# Patient Record
Sex: Female | Born: 1955 | Race: Black or African American | Hispanic: No | Marital: Married | Smoking: Never smoker
Health system: Southern US, Community
[De-identification: ages and names within clinical notes are randomized; demographics above are authoritative.]

## PROBLEM LIST (undated history)

## (undated) DIAGNOSIS — K219 Gastro-esophageal reflux disease without esophagitis: Secondary | ICD-10-CM

## (undated) DIAGNOSIS — I10 Essential (primary) hypertension: Secondary | ICD-10-CM

## (undated) DIAGNOSIS — F32A Depression, unspecified: Secondary | ICD-10-CM

## (undated) DIAGNOSIS — F329 Major depressive disorder, single episode, unspecified: Secondary | ICD-10-CM

## (undated) DIAGNOSIS — F419 Anxiety disorder, unspecified: Secondary | ICD-10-CM

## (undated) DIAGNOSIS — D649 Anemia, unspecified: Secondary | ICD-10-CM

## (undated) HISTORY — PX: ABDOMINAL HYSTERECTOMY: SHX81

---

## 1998-06-19 ENCOUNTER — Encounter: Payer: Self-pay | Admitting: Obstetrics

## 1998-06-19 ENCOUNTER — Ambulatory Visit (HOSPITAL_COMMUNITY): Admission: RE | Admit: 1998-06-19 | Discharge: 1998-06-19 | Payer: Self-pay | Admitting: Obstetrics

## 1999-08-06 ENCOUNTER — Encounter: Payer: Self-pay | Admitting: Obstetrics

## 1999-08-06 ENCOUNTER — Ambulatory Visit (HOSPITAL_COMMUNITY): Admission: RE | Admit: 1999-08-06 | Discharge: 1999-08-06 | Payer: Self-pay | Admitting: Obstetrics

## 2000-12-04 ENCOUNTER — Encounter: Payer: Self-pay | Admitting: Obstetrics

## 2000-12-04 ENCOUNTER — Ambulatory Visit (HOSPITAL_COMMUNITY): Admission: RE | Admit: 2000-12-04 | Discharge: 2000-12-04 | Payer: Self-pay | Admitting: Obstetrics

## 2001-09-07 ENCOUNTER — Encounter (HOSPITAL_BASED_OUTPATIENT_CLINIC_OR_DEPARTMENT_OTHER): Payer: Self-pay | Admitting: General Surgery

## 2001-09-07 ENCOUNTER — Ambulatory Visit (HOSPITAL_COMMUNITY): Admission: RE | Admit: 2001-09-07 | Discharge: 2001-09-07 | Payer: Self-pay | Admitting: General Surgery

## 2001-09-24 ENCOUNTER — Ambulatory Visit: Admission: RE | Admit: 2001-09-24 | Discharge: 2001-09-24 | Payer: Self-pay | Admitting: Gastroenterology

## 2001-09-24 ENCOUNTER — Encounter (INDEPENDENT_AMBULATORY_CARE_PROVIDER_SITE_OTHER): Payer: Self-pay | Admitting: Specialist

## 2001-11-24 ENCOUNTER — Encounter: Payer: Self-pay | Admitting: Obstetrics

## 2001-11-24 ENCOUNTER — Ambulatory Visit (HOSPITAL_COMMUNITY): Admission: RE | Admit: 2001-11-24 | Discharge: 2001-11-24 | Payer: Self-pay | Admitting: Obstetrics

## 2002-12-03 ENCOUNTER — Emergency Department (HOSPITAL_COMMUNITY): Admission: EM | Admit: 2002-12-03 | Discharge: 2002-12-03 | Payer: Self-pay | Admitting: Emergency Medicine

## 2004-01-24 ENCOUNTER — Encounter: Admission: RE | Admit: 2004-01-24 | Discharge: 2004-01-24 | Payer: Self-pay | Admitting: Obstetrics

## 2004-08-27 ENCOUNTER — Ambulatory Visit (HOSPITAL_COMMUNITY): Admission: RE | Admit: 2004-08-27 | Discharge: 2004-08-27 | Payer: Self-pay | Admitting: Family Medicine

## 2004-08-27 ENCOUNTER — Emergency Department (HOSPITAL_COMMUNITY): Admission: EM | Admit: 2004-08-27 | Discharge: 2004-08-27 | Payer: Self-pay | Admitting: Family Medicine

## 2005-07-21 ENCOUNTER — Emergency Department (HOSPITAL_COMMUNITY): Admission: EM | Admit: 2005-07-21 | Discharge: 2005-07-21 | Payer: Self-pay | Admitting: Emergency Medicine

## 2005-07-31 ENCOUNTER — Encounter: Admission: RE | Admit: 2005-07-31 | Discharge: 2005-07-31 | Payer: Self-pay | Admitting: Obstetrics

## 2006-01-12 ENCOUNTER — Emergency Department (HOSPITAL_COMMUNITY): Admission: EM | Admit: 2006-01-12 | Discharge: 2006-01-12 | Payer: Self-pay | Admitting: Emergency Medicine

## 2007-01-19 ENCOUNTER — Emergency Department (HOSPITAL_COMMUNITY): Admission: EM | Admit: 2007-01-19 | Discharge: 2007-01-19 | Payer: Self-pay | Admitting: Emergency Medicine

## 2007-02-03 ENCOUNTER — Ambulatory Visit (HOSPITAL_COMMUNITY): Admission: RE | Admit: 2007-02-03 | Discharge: 2007-02-03 | Payer: Self-pay | Admitting: Emergency Medicine

## 2007-04-01 ENCOUNTER — Encounter: Admission: RE | Admit: 2007-04-01 | Discharge: 2007-04-01 | Payer: Self-pay | Admitting: Internal Medicine

## 2008-04-19 ENCOUNTER — Encounter: Admission: RE | Admit: 2008-04-19 | Discharge: 2008-04-19 | Payer: Self-pay | Admitting: Internal Medicine

## 2008-04-21 ENCOUNTER — Encounter: Admission: RE | Admit: 2008-04-21 | Discharge: 2008-04-21 | Payer: Self-pay | Admitting: Internal Medicine

## 2009-04-19 ENCOUNTER — Encounter: Admission: RE | Admit: 2009-04-19 | Discharge: 2009-04-19 | Payer: Self-pay | Admitting: Internal Medicine

## 2010-04-23 ENCOUNTER — Encounter: Admission: RE | Admit: 2010-04-23 | Discharge: 2010-04-23 | Payer: Self-pay | Admitting: Internal Medicine

## 2010-09-15 ENCOUNTER — Encounter: Payer: Self-pay | Admitting: Emergency Medicine

## 2010-09-15 ENCOUNTER — Encounter: Payer: Self-pay | Admitting: Obstetrics

## 2010-09-16 ENCOUNTER — Encounter: Payer: Self-pay | Admitting: Internal Medicine

## 2011-01-10 NOTE — Procedures (Signed)
[facility]  Patient:    Michele Conrad Visit Number: [id number] MRN: [id number]          Service Type: Attending:  Petra Kuba, M.D. Dictated by:   Petra Kuba, M.D. Proc. Date: 09/24/01   CC:         Luisa Hart L. Lurene Shadow, M.D.  Kathreen Cosier, M.D.   Procedure Report  PROCEDURE:  Colonoscopy with polypectomy.  Consent was signed after risks, benefits, methods and options were thoroughly discussed in the office.  MEDICINES USED:  Demerol 100 mg, Versed 10 mg.  INDICATION:  Family history of colon cancer; some mild changes in bowel habits; right lower quadrant pain, questionable adhesional versus hernia.  DESCRIPTION OF PROCEDURE:  Rectal inspection was pertinent for external hemorrhoids, small. Digital exam was negative. Pediatric video colonoscope was inserted and easily advanced around the colon to the cecum. This did not require any abdominal pressure or any position changes. No obvious abnormality was seen on insertion. The cecum was identified by the appendiceal orifice and the ileocecal valve. _______ scope was inserted shortways in the terminal ileum which was normal. Photo documentation was obtained. The scope was slowly withdrawn. In the ascending colon, one diverticulum was seen. The scope was slowly withdrawn. No other diverticula were seen as we slowly withdrew back to the rectum. In the proximal level of the splenic level, a 3-mm polyp was seen and hot biopsied x 2. On slow withdrawal, three other tiny questionable polyps were seen in the sigmoid and hot biopsied, put in the same container. No other abnormalities were seen as we slowly withdrew back the rectum. Once back in the rectum, the scope was retroflexed which revealed some internal hemorrhoids. The scope was straightened, readvanced a short ways up the left side of the colon. Air was suctioned, scope removed. The patient tolerated the procedure well. There was no  obvious immediate complication.  ENDOSCOPIC DIAGNOSES: 1. Internal/external hemorrhoids. 2. One right-sided diverticulum is seen. 3. Four left-sided tiny to small polyps seen, all hot biopsied. 4. Otherwise within normal limits to the terminal ileum.  PLAN:  Await pathology to determine future colonic screening. Happy to see back p.r.n., otherwise return care to Dr. Lurene Shadow and Dr. Gaynell Face for the customary healthcare maintenance to include yearly rectals and guaiacs. Put her on the customary one week postpolypectomy diet. Dictated by:   Petra Kuba, M.D. Attending:  Petra Kuba, M.D. DD:  09/24/01 TD:  09/25/01 Job: [id number]

## 2011-05-30 ENCOUNTER — Other Ambulatory Visit: Payer: Self-pay | Admitting: Internal Medicine

## 2011-05-30 DIAGNOSIS — Z1231 Encounter for screening mammogram for malignant neoplasm of breast: Secondary | ICD-10-CM

## 2011-06-12 ENCOUNTER — Ambulatory Visit
Admission: RE | Admit: 2011-06-12 | Discharge: 2011-06-12 | Disposition: A | Discharge status: Home / Self Care | Payer: PRIVATE HEALTH INSURANCE | Source: Ambulatory Visit | Attending: Internal Medicine | Admitting: Internal Medicine

## 2011-06-12 DIAGNOSIS — Z1231 Encounter for screening mammogram for malignant neoplasm of breast: Secondary | ICD-10-CM

## 2011-09-26 ENCOUNTER — Other Ambulatory Visit (HOSPITAL_COMMUNITY)
Admission: RE | Admit: 2011-09-26 | Discharge: 2011-09-26 | Disposition: A | Discharge status: Home / Self Care | Payer: 59 | Source: Ambulatory Visit | Attending: Internal Medicine | Admitting: Internal Medicine

## 2011-09-26 DIAGNOSIS — Z01419 Encounter for gynecological examination (general) (routine) without abnormal findings: Secondary | ICD-10-CM | POA: Insufficient documentation

## 2011-09-26 DIAGNOSIS — Z1159 Encounter for screening for other viral diseases: Secondary | ICD-10-CM | POA: Insufficient documentation

## 2012-06-15 ENCOUNTER — Other Ambulatory Visit: Payer: Self-pay | Admitting: Internal Medicine

## 2012-06-15 DIAGNOSIS — Z1231 Encounter for screening mammogram for malignant neoplasm of breast: Secondary | ICD-10-CM

## 2012-07-02 ENCOUNTER — Ambulatory Visit (HOSPITAL_COMMUNITY)
Admission: RE | Admit: 2012-07-02 | Discharge: 2012-07-02 | Disposition: A | Discharge status: Home / Self Care | Payer: 59 | Source: Ambulatory Visit | Attending: Internal Medicine | Admitting: Internal Medicine

## 2012-07-02 DIAGNOSIS — Z1231 Encounter for screening mammogram for malignant neoplasm of breast: Secondary | ICD-10-CM | POA: Insufficient documentation

## 2012-07-19 ENCOUNTER — Ambulatory Visit: Payer: 59

## 2012-07-21 ENCOUNTER — Encounter (HOSPITAL_COMMUNITY): Payer: Self-pay | Admitting: Pharmacy Technician

## 2012-07-28 ENCOUNTER — Encounter (HOSPITAL_COMMUNITY)
Admission: RE | Disposition: A | Discharge status: Home / Self Care | Payer: Self-pay | Source: Ambulatory Visit | Attending: Gastroenterology

## 2012-07-28 ENCOUNTER — Ambulatory Visit (HOSPITAL_COMMUNITY)
Admission: RE | Admit: 2012-07-28 | Discharge: 2012-07-28 | Disposition: A | Discharge status: Home / Self Care | Payer: 59 | Source: Ambulatory Visit | Attending: Gastroenterology | Admitting: Gastroenterology

## 2012-07-28 ENCOUNTER — Encounter (HOSPITAL_COMMUNITY): Payer: Self-pay

## 2012-07-28 DIAGNOSIS — K644 Residual hemorrhoidal skin tags: Secondary | ICD-10-CM | POA: Insufficient documentation

## 2012-07-28 DIAGNOSIS — R109 Unspecified abdominal pain: Secondary | ICD-10-CM | POA: Insufficient documentation

## 2012-07-28 DIAGNOSIS — Z800 Family history of malignant neoplasm of digestive organs: Secondary | ICD-10-CM | POA: Insufficient documentation

## 2012-07-28 DIAGNOSIS — K648 Other hemorrhoids: Secondary | ICD-10-CM | POA: Insufficient documentation

## 2012-07-28 DIAGNOSIS — Z860100 Personal history of colon polyps, unspecified: Secondary | ICD-10-CM | POA: Insufficient documentation

## 2012-07-28 DIAGNOSIS — K625 Hemorrhage of anus and rectum: Secondary | ICD-10-CM | POA: Insufficient documentation

## 2012-07-28 HISTORY — DX: Gastro-esophageal reflux disease without esophagitis: K21.9

## 2012-07-28 HISTORY — DX: Depression, unspecified: F32.A

## 2012-07-28 HISTORY — DX: Anemia, unspecified: D64.9

## 2012-07-28 HISTORY — DX: Major depressive disorder, single episode, unspecified: F32.9

## 2012-07-28 HISTORY — DX: Essential (primary) hypertension: I10

## 2012-07-28 HISTORY — PX: COLONOSCOPY: SHX5424

## 2012-07-28 HISTORY — DX: Anxiety disorder, unspecified: F41.9

## 2012-07-28 SURGERY — COLONOSCOPY
Anesthesia: Moderate Sedation

## 2012-07-28 MED ORDER — FENTANYL CITRATE 0.05 MG/ML IJ SOLN
INTRAMUSCULAR | Status: AC
  Filled 2012-07-28: qty 2

## 2012-07-28 MED ORDER — SODIUM CHLORIDE 0.9 % IV SOLN
INTRAVENOUS | Status: DC
  Administered 2012-07-28: 500 mL via INTRAVENOUS

## 2012-07-28 MED ORDER — MIDAZOLAM HCL 5 MG/ML IJ SOLN
INTRAMUSCULAR | Status: AC
  Filled 2012-07-28: qty 1

## 2012-07-28 MED ORDER — MIDAZOLAM HCL 5 MG/5ML IJ SOLN
INTRAMUSCULAR | Status: DC | PRN
  Administered 2012-07-28: 1 mg via INTRAVENOUS
  Administered 2012-07-28: 2 mg via INTRAVENOUS
  Administered 2012-07-28 (×2): 1.5 mg via INTRAVENOUS
  Administered 2012-07-28: 2 mg via INTRAVENOUS
  Administered 2012-07-28 (×2): 1 mg via INTRAVENOUS

## 2012-07-28 MED ORDER — MIDAZOLAM HCL 5 MG/ML IJ SOLN
INTRAMUSCULAR | Status: AC
  Filled 2012-07-28: qty 2

## 2012-07-28 MED ORDER — SODIUM CHLORIDE 0.9 % IV SOLN: INTRAVENOUS | Status: DC

## 2012-07-28 MED ORDER — FENTANYL CITRATE 0.05 MG/ML IJ SOLN
INTRAMUSCULAR | Status: DC | PRN
  Administered 2012-07-28: 25 ug via INTRAVENOUS
  Administered 2012-07-28: 15 ug via INTRAVENOUS
  Administered 2012-07-28 (×2): 25 ug via INTRAVENOUS
  Administered 2012-07-28: 10 ug via INTRAVENOUS

## 2012-07-28 NOTE — Progress Notes (Signed)
Michele Conrad 8:46 AM  Subjective: Patient without any new complaints since I saw her in the office and without any further bleeding and no change in her pain  Objective: Vital signs stable afebrile no acute distress exam please see pre-assessment evaluation  Assessment: Personal history of colon polyps family history of colon cancer abdominal pain periodic blood in stool  Plan: Okay to proceed with colonoscopy today  Sherlock Nancarrow E

## 2012-07-28 NOTE — Op Note (Signed)
[facility] [address] [place], [address]   COLONOSCOPY PROCEDURE REPORT  PATIENT: Michele Conrad, Michele Conrad  MR#: [id number] BIRTHDATE: [birth year] , 56  yrs. old GENDER: Female ENDOSCOPIST: Vida Rigger, MD REFERRED [id number] Polite, M.D. PROCEDURE DATE:  07/28/2012 PROCEDURE:   Colonoscopy, diagnostic ASA CLASS:   Class II INDICATIONS:Rectal Bleeding.  chronic abdominal pain family history of colon cancer personal history of colon polyp MEDICATIONS: Fentanyl 100 mcg IV and Versed 10 mg IV  DESCRIPTION OF PROCEDURE:   After the risks benefits and alternatives of the procedure were thoroughly explained, informed consent was obtained.  The Pentax Colonoscope [id number]  endoscope was introduced through the anus and advanced to the terminal ileum which was intubated for a short distance , limited by No adverse events experienced.   The quality of the prep was adequate. .  The instrument was then slowly withdrawn as the colon was fully examined.to advanced to the cecum did not require any abdominal pressure or position changes and no obvious abnormality was seen on insertion the cecum was identified by the appendiceal orifice and ileocecal intact the scope was inserted sure within the terminal ileum which was normal and on slow withdrawal no abnormalities were seen him once back in the rectum anal rectal pull-through retroflexion confirmed some small hemorrhoids the scope was reinserted to shortness of the left side of the colon air was suctioned scope removed patient tolerated the procedure adequately there is no obvious immediate complication       FINDINGS:  internal/external hemorrhoid otherwise within normal limits to the terminal ileum  COMPLICATIONS: none IMPRESSION:  above  RECOMMENDATIONS: if pain continues consider repeat CT scan happy to see back when necessary repeat colon screening in 5  years   _______________________________ eSigned:  Vida Rigger, MD 07/28/2012 9:48 AM   [id number], Windy Fast MD

## 2012-07-29 ENCOUNTER — Encounter (HOSPITAL_COMMUNITY): Payer: Self-pay | Admitting: Gastroenterology

## 2013-04-18 ENCOUNTER — Other Ambulatory Visit: Payer: Self-pay

## 2013-04-18 DIAGNOSIS — Z1231 Encounter for screening mammogram for malignant neoplasm of breast: Secondary | ICD-10-CM

## 2013-07-04 ENCOUNTER — Ambulatory Visit
Admission: RE | Admit: 2013-07-04 | Discharge: 2013-07-04 | Disposition: A | Discharge status: Home / Self Care | Payer: 59 | Source: Ambulatory Visit

## 2013-07-04 DIAGNOSIS — Z1231 Encounter for screening mammogram for malignant neoplasm of breast: Secondary | ICD-10-CM

## 2014-07-28 ENCOUNTER — Other Ambulatory Visit: Payer: Self-pay

## 2014-07-28 DIAGNOSIS — Z1231 Encounter for screening mammogram for malignant neoplasm of breast: Secondary | ICD-10-CM

## 2014-08-02 ENCOUNTER — Ambulatory Visit
Admission: RE | Admit: 2014-08-02 | Discharge: 2014-08-02 | Disposition: A | Discharge status: Home / Self Care | Payer: 59 | Source: Ambulatory Visit

## 2014-08-02 DIAGNOSIS — Z1231 Encounter for screening mammogram for malignant neoplasm of breast: Secondary | ICD-10-CM

## 2015-08-08 ENCOUNTER — Other Ambulatory Visit: Payer: Self-pay

## 2015-08-08 DIAGNOSIS — Z1231 Encounter for screening mammogram for malignant neoplasm of breast: Secondary | ICD-10-CM

## 2015-08-09 ENCOUNTER — Ambulatory Visit
Admission: RE | Admit: 2015-08-09 | Discharge: 2015-08-09 | Disposition: A | Discharge status: Home / Self Care | Payer: 59 | Source: Ambulatory Visit

## 2015-08-09 DIAGNOSIS — Z1231 Encounter for screening mammogram for malignant neoplasm of breast: Secondary | ICD-10-CM

## 2015-08-31 MED FILL — traMADol HCL 50 MG TABS: 50 | 22 days supply | Qty: 45 | Fill #0

## 2015-09-20 MED FILL — ESZOPICLONE 2 MG TABLET: 2 | 30 days supply | Qty: 30 | Fill #0

## 2015-09-20 MED FILL — MELOXICAM 15 MG TABLET: 15 | 30 days supply | Qty: 30 | Fill #4

## 2015-10-01 MED FILL — traMADol HCL 50 MG TABS: 50 | 30 days supply | Qty: 45 | Fill #1

## 2015-10-23 MED FILL — ESZOPICLONE 2 MG TABLET: 2 | 30 days supply | Qty: 30 | Fill #1

## 2015-10-23 MED FILL — MELOXICAM 15 MG TABLET: 15 | 30 days supply | Qty: 30 | Fill #5

## 2015-10-29 MED FILL — traMADol HCL 50 MG TABS: 50 | 30 days supply | Qty: 45 | Fill #2

## 2015-10-31 MED FILL — ZOLPIDEM TART ER 12.5 MG TA: 12.5 | 20 days supply | Qty: 20 | Fill #0

## 2015-11-30 MED FILL — ZOLPIDEM TART ER 12.5 MG TA: 12.5 | 20 days supply | Qty: 20 | Fill #1

## 2015-11-30 MED FILL — traMADol HCL 50 MG TABS: 50 | 30 days supply | Qty: 45 | Fill #3

## 2015-12-03 MED FILL — IBUPROFEN 800 MG TABLET: 800 | 30 days supply | Qty: 60 | Fill #0

## 2015-12-03 MED FILL — ALPRAZolam 0.25 MG TABS: 0.25 | 30 days supply | Qty: 30 | Fill #0

## 2015-12-04 MED FILL — HYDROCHLOROTHIAZIDE 12.5 MG: 12.5 | 90 days supply | Qty: 90 | Fill #2

## 2015-12-14 MED FILL — DULoxetine HCL 60 MG CPEP: 60 | 90 days supply | Qty: 90 | Fill #2

## 2015-12-21 MED FILL — ESZOPICLONE 2 MG TABLET: 2 | 30 days supply | Qty: 30 | Fill #2

## 2015-12-31 MED FILL — ZOLPIDEM TART ER 12.5 MG TA: 12.5 | 20 days supply | Qty: 20 | Fill #2

## 2015-12-31 MED FILL — MELOXICAM 15 MG TABLET: 15 | 30 days supply | Qty: 30 | Fill #6

## 2016-01-22 MED FILL — ESZOPICLONE 2 MG TABLET: 2 | 30 days supply | Qty: 30 | Fill #3

## 2016-02-01 MED FILL — TRETINOIN 0.025% CREAM: 0.025 | 30 days supply | Qty: 20 | Fill #0

## 2016-02-01 MED FILL — ZOLPIDEM TART ER 12.5 MG TA: 12.5 | 20 days supply | Qty: 20 | Fill #3

## 2016-02-22 MED FILL — IBUPROFEN 800 MG TABLET: 800 | 30 days supply | Qty: 60 | Fill #0

## 2016-02-22 MED FILL — ESZOPICLONE 2 MG TABLET: 2 | 30 days supply | Qty: 30 | Fill #4

## 2016-03-03 MED FILL — ZOLPIDEM TART ER 12.5 MG TA: 12.5 | 20 days supply | Qty: 20 | Fill #0

## 2016-03-07 MED FILL — HYDROCHLOROTHIAZIDE 12.5 MG: 12.5 | 90 days supply | Qty: 90 | Fill #0

## 2016-03-17 MED FILL — IBUPROFEN 800 MG TABLET: 800 | 30 days supply | Qty: 60 | Fill #1

## 2016-03-17 MED FILL — DULoxetine HCL 60 MG CPEP: 60 | 90 days supply | Qty: 90 | Fill #3

## 2016-04-03 MED FILL — ZOLPIDEM TART ER 12.5 MG TA: 12.5 | 20 days supply | Qty: 20 | Fill #1

## 2016-04-30 DIAGNOSIS — F3341 Major depressive disorder, recurrent, in partial remission: Secondary | ICD-10-CM | POA: Diagnosis not present

## 2016-04-30 DIAGNOSIS — Z5181 Encounter for therapeutic drug level monitoring: Secondary | ICD-10-CM | POA: Diagnosis not present

## 2016-04-30 DIAGNOSIS — F419 Anxiety disorder, unspecified: Secondary | ICD-10-CM | POA: Diagnosis not present

## 2016-04-30 DIAGNOSIS — I10 Essential (primary) hypertension: Secondary | ICD-10-CM | POA: Diagnosis not present

## 2016-04-30 DIAGNOSIS — R1032 Left lower quadrant pain: Secondary | ICD-10-CM | POA: Diagnosis not present

## 2016-04-30 DIAGNOSIS — G4700 Insomnia, unspecified: Secondary | ICD-10-CM | POA: Diagnosis not present

## 2016-04-30 DIAGNOSIS — E663 Overweight: Secondary | ICD-10-CM | POA: Diagnosis not present

## 2016-04-30 DIAGNOSIS — M2550 Pain in unspecified joint: Secondary | ICD-10-CM | POA: Diagnosis not present

## 2016-04-30 DIAGNOSIS — R8290 Unspecified abnormal findings in urine: Secondary | ICD-10-CM | POA: Diagnosis not present

## 2016-04-30 MED FILL — traMADol HCL 50 MG TABS: 50 | 23 days supply | Qty: 45 | Fill #0

## 2016-04-30 MED FILL — IBUPROFEN 800 MG TABLET: 800 | 90 days supply | Qty: 180 | Fill #0

## 2016-04-30 MED FILL — ZOLPIDEM TART ER 12.5 MG TA: 12.5 | 30 days supply | Qty: 30 | Fill #0

## 2016-04-30 MED FILL — ALPRAZolam 0.25 MG TABS: 0.25 | 90 days supply | Qty: 90 | Fill #0

## 2016-04-30 MED FILL — CIPROFLOXACIN HCL 250 MG TA: 250 | 5 days supply | Qty: 10 | Fill #0

## 2016-04-30 MED FILL — MELOXICAM 15 MG TABLET: 15 | 90 days supply | Qty: 90 | Fill #0

## 2016-04-30 MED FILL — ESZOPICLONE 2 MG TABLET: 2 | 30 days supply | Qty: 30 | Fill #0

## 2016-05-06 MED FILL — AMPICILLIN TR 500 MG CAP: 500 | 5 days supply | Qty: 15 | Fill #0

## 2016-06-09 MED FILL — ZOLPIDEM TART ER 12.5 MG TA: 12.5 | 30 days supply | Qty: 30 | Fill #1

## 2016-06-10 MED FILL — HYDROCHLOROTHIAZIDE 12.5 MG: 12.5 | 90 days supply | Qty: 90 | Fill #0

## 2016-06-10 MED FILL — DULoxetine HCL 60 MG CPEP: 60 | 90 days supply | Qty: 90 | Fill #0

## 2016-06-18 MED FILL — traMADol HCL 50 MG TABS: 50 | 22 days supply | Qty: 45 | Fill #0

## 2016-06-23 DIAGNOSIS — R768 Other specified abnormal immunological findings in serum: Secondary | ICD-10-CM | POA: Diagnosis not present

## 2016-06-23 DIAGNOSIS — R5383 Other fatigue: Secondary | ICD-10-CM | POA: Diagnosis not present

## 2016-06-23 DIAGNOSIS — M2550 Pain in unspecified joint: Secondary | ICD-10-CM | POA: Diagnosis not present

## 2016-06-23 DIAGNOSIS — M791 Myalgia: Secondary | ICD-10-CM | POA: Diagnosis not present

## 2016-06-23 DIAGNOSIS — M150 Primary generalized (osteo)arthritis: Secondary | ICD-10-CM | POA: Diagnosis not present

## 2016-07-10 MED FILL — ZOLPIDEM TART ER 12.5 MG TA: 12.5 | 30 days supply | Qty: 30 | Fill #2

## 2016-07-14 DIAGNOSIS — M2550 Pain in unspecified joint: Secondary | ICD-10-CM | POA: Diagnosis not present

## 2016-07-14 DIAGNOSIS — R7982 Elevated C-reactive protein (CRP): Secondary | ICD-10-CM | POA: Diagnosis not present

## 2016-07-14 DIAGNOSIS — M791 Myalgia: Secondary | ICD-10-CM | POA: Diagnosis not present

## 2016-07-14 DIAGNOSIS — R768 Other specified abnormal immunological findings in serum: Secondary | ICD-10-CM | POA: Diagnosis not present

## 2016-07-14 DIAGNOSIS — M150 Primary generalized (osteo)arthritis: Secondary | ICD-10-CM | POA: Diagnosis not present

## 2016-07-24 MED FILL — traMADol HCL 50 MG TABS: 50 | 30 days supply | Qty: 45 | Fill #0

## 2016-08-13 MED FILL — ESZOPICLONE 2 MG TABLET: 2 | 30 days supply | Qty: 30 | Fill #1

## 2016-08-21 ENCOUNTER — Other Ambulatory Visit: Payer: Self-pay | Admitting: Internal Medicine

## 2016-08-21 DIAGNOSIS — Z1231 Encounter for screening mammogram for malignant neoplasm of breast: Secondary | ICD-10-CM

## 2016-09-05 MED FILL — ZOLPIDEM TART ER 12.5 MG TA: 12.5 | 90 days supply | Qty: 90 | Fill #0

## 2016-09-05 MED FILL — traMADol HCL 50 MG TABS: 50 | 30 days supply | Qty: 45 | Fill #0

## 2016-09-05 MED FILL — DULoxetine HCL 60 MG CPEP: 60 | 90 days supply | Qty: 90 | Fill #0

## 2016-09-05 MED FILL — HYDROCHLOROTHIAZIDE 12.5 MG: 12.5 | 90 days supply | Qty: 90 | Fill #0

## 2016-09-05 MED FILL — MELOXICAM 15 MG TABLET: 15 | 90 days supply | Qty: 90 | Fill #0

## 2016-09-16 ENCOUNTER — Ambulatory Visit
Admission: RE | Admit: 2016-09-16 | Discharge: 2016-09-16 | Disposition: A | Discharge status: Home / Self Care | Payer: 59 | Source: Ambulatory Visit | Attending: Internal Medicine | Admitting: Internal Medicine

## 2016-09-16 DIAGNOSIS — Z1231 Encounter for screening mammogram for malignant neoplasm of breast: Secondary | ICD-10-CM | POA: Diagnosis not present

## 2016-09-17 MED FILL — ALPRAZolam 0.25 MG TABS: 0.25 | 90 days supply | Qty: 90 | Fill #0

## 2016-10-02 DIAGNOSIS — M545 Low back pain: Secondary | ICD-10-CM | POA: Diagnosis not present

## 2016-10-02 DIAGNOSIS — F419 Anxiety disorder, unspecified: Secondary | ICD-10-CM | POA: Diagnosis not present

## 2016-10-02 DIAGNOSIS — G4700 Insomnia, unspecified: Secondary | ICD-10-CM | POA: Diagnosis not present

## 2016-10-02 DIAGNOSIS — Z0000 Encounter for general adult medical examination without abnormal findings: Secondary | ICD-10-CM | POA: Diagnosis not present

## 2016-10-02 DIAGNOSIS — E663 Overweight: Secondary | ICD-10-CM | POA: Diagnosis not present

## 2016-10-02 DIAGNOSIS — K219 Gastro-esophageal reflux disease without esophagitis: Secondary | ICD-10-CM | POA: Diagnosis not present

## 2016-10-02 DIAGNOSIS — I10 Essential (primary) hypertension: Secondary | ICD-10-CM | POA: Diagnosis not present

## 2016-10-02 DIAGNOSIS — F3341 Major depressive disorder, recurrent, in partial remission: Secondary | ICD-10-CM | POA: Diagnosis not present

## 2016-10-02 DIAGNOSIS — Z800 Family history of malignant neoplasm of digestive organs: Secondary | ICD-10-CM | POA: Diagnosis not present

## 2016-10-02 MED FILL — METHOCARBAMOL 500 MG TABLET: 500 | 9 days supply | Qty: 45 | Fill #0

## 2016-10-06 MED FILL — traMADol HCL 50 MG TABS: 50 | 30 days supply | Qty: 45 | Fill #1

## 2016-11-05 MED FILL — traMADol HCL 50 MG TABS: 50 | 30 days supply | Qty: 45 | Fill #2

## 2016-12-11 MED FILL — HYDROCHLOROTHIAZIDE 12.5 MG: 12.5 | 90 days supply | Qty: 90 | Fill #1

## 2016-12-11 MED FILL — MELOXICAM 15 MG TABLET: 15 | 90 days supply | Qty: 90 | Fill #1

## 2016-12-11 MED FILL — DULoxetine HCL 60 MG CPEP: 60 | 90 days supply | Qty: 90 | Fill #1

## 2016-12-11 MED FILL — ZOLPIDEM TART ER 12.5 MG TA: 12.5 | 90 days supply | Qty: 90 | Fill #1

## 2016-12-18 MED FILL — traMADol HCL 50 MG TABS: 50 | 30 days supply | Qty: 45 | Fill #0

## 2016-12-18 MED FILL — ALPRAZolam 0.25 MG TABS: 0.25 | 90 days supply | Qty: 90 | Fill #0

## 2016-12-25 MED FILL — METHOCARBAMOL 500 MG TABLET: 500 | 9 days supply | Qty: 45 | Fill #1

## 2017-01-23 MED FILL — traMADol HCL 50 MG TABS: 50 | 30 days supply | Qty: 45 | Fill #1

## 2017-02-20 MED FILL — ESZOPICLONE 2 MG TAB: 2 | 90 days supply | Qty: 90 | Fill #0

## 2017-02-27 MED FILL — METHOCARBAMOL 500 MG TABLET: 500 | 9 days supply | Qty: 45 | Fill #2

## 2017-03-10 MED FILL — HYDROCHLOROTHIAZIDE 12.5 MG: 12.5 | 90 days supply | Qty: 90 | Fill #2

## 2017-03-10 MED FILL — DULoxetine HCL 60 MG CPEP: 60 | 90 days supply | Qty: 90 | Fill #2

## 2017-03-11 MED FILL — traMADol HCL 50 MG TABS: 50 | 22 days supply | Qty: 45 | Fill #0

## 2017-04-14 MED FILL — METHOCARBAMOL 500 MG TABLET: 500 | 9 days supply | Qty: 45 | Fill #3

## 2017-04-14 MED FILL — traMADol HCL 50 MG TABS: 50 | 22 days supply | Qty: 45 | Fill #1

## 2017-04-16 MED FILL — ZOLPIDEM TART ER 12.5 MG TA: 12.5 | 90 days supply | Qty: 90 | Fill #0

## 2017-04-16 MED FILL — ALPRAZolam 0.25 MG TABS: 0.25 | 90 days supply | Qty: 90 | Fill #0

## 2017-05-15 MED FILL — traMADol HCL 50 MG TABS: 50 | 22 days supply | Qty: 45 | Fill #2

## 2017-06-16 MED FILL — DULoxetine HCL 60 MG CPEP: 60 | 90 days supply | Qty: 90 | Fill #3

## 2017-06-16 MED FILL — HYDROCHLOROTHIAZIDE 12.5 MG: 12.5 | 90 days supply | Qty: 90 | Fill #3

## 2017-06-18 MED FILL — traMADol HCL 50 MG TABS: 50 | 22 days supply | Qty: 45 | Fill #0

## 2017-06-18 MED FILL — METHOCARBAMOL 500 MG TABS: 500 | 10 days supply | Qty: 45 | Fill #0

## 2017-06-18 MED FILL — ESZOPICLONE 2 MG TAB: 2 | 90 days supply | Qty: 90 | Fill #0

## 2017-06-29 DIAGNOSIS — H524 Presbyopia: Secondary | ICD-10-CM | POA: Diagnosis not present

## 2017-07-20 MED FILL — METHOCARBAMOL 500 MG TABS: 500 | 10 days supply | Qty: 45 | Fill #1

## 2017-07-20 MED FILL — traMADol HCL 50 MG TABS: 50 | 22 days supply | Qty: 45 | Fill #1

## 2017-07-20 MED FILL — ZOLPIDEM TART ER 12.5 MG TA: 12.5 | 90 days supply | Qty: 90 | Fill #1

## 2017-08-26 MED FILL — traMADol HCL 50 MG TABS: 50 | 22 days supply | Qty: 45 | Fill #2

## 2017-09-17 MED FILL — ESZOPICLONE 2 MG TAB: 2 | 90 days supply | Qty: 90 | Fill #1

## 2017-09-18 MED FILL — HYDROCHLOROTHIAZIDE 12.5 MG: 12.5 | 90 days supply | Qty: 90 | Fill #0

## 2017-09-18 MED FILL — DULoxetine HCL 60 MG CPEP: 60 | 90 days supply | Qty: 90 | Fill #0

## 2017-09-18 MED FILL — IBUPROFEN 800 MG TABS: 800 | 90 days supply | Qty: 180 | Fill #0

## 2017-09-21 ENCOUNTER — Other Ambulatory Visit: Payer: Self-pay | Admitting: Internal Medicine

## 2017-09-21 DIAGNOSIS — Z1231 Encounter for screening mammogram for malignant neoplasm of breast: Secondary | ICD-10-CM

## 2017-09-22 ENCOUNTER — Ambulatory Visit
Admission: RE | Admit: 2017-09-22 | Discharge: 2017-09-22 | Disposition: A | Discharge status: Home / Self Care | Payer: 59 | Source: Ambulatory Visit | Attending: Internal Medicine | Admitting: Internal Medicine

## 2017-09-22 DIAGNOSIS — Z1231 Encounter for screening mammogram for malignant neoplasm of breast: Secondary | ICD-10-CM

## 2017-10-05 DIAGNOSIS — G4700 Insomnia, unspecified: Secondary | ICD-10-CM | POA: Diagnosis not present

## 2017-10-05 DIAGNOSIS — F3341 Major depressive disorder, recurrent, in partial remission: Secondary | ICD-10-CM | POA: Diagnosis not present

## 2017-10-05 DIAGNOSIS — G8929 Other chronic pain: Secondary | ICD-10-CM | POA: Diagnosis not present

## 2017-10-05 DIAGNOSIS — E663 Overweight: Secondary | ICD-10-CM | POA: Diagnosis not present

## 2017-10-05 DIAGNOSIS — Z13220 Encounter for screening for lipoid disorders: Secondary | ICD-10-CM | POA: Diagnosis not present

## 2017-10-05 DIAGNOSIS — F419 Anxiety disorder, unspecified: Secondary | ICD-10-CM | POA: Diagnosis not present

## 2017-10-05 DIAGNOSIS — Z0000 Encounter for general adult medical examination without abnormal findings: Secondary | ICD-10-CM | POA: Diagnosis not present

## 2017-10-05 DIAGNOSIS — M545 Low back pain: Secondary | ICD-10-CM | POA: Diagnosis not present

## 2017-10-16 MED FILL — traMADol HCL 50 MG TABS: 50 | 30 days supply | Qty: 45 | Fill #0

## 2017-10-26 MED FILL — ZOLPIDEM TART ER 12.5 MG TA: 12.5 | 90 days supply | Qty: 90 | Fill #0

## 2017-10-28 MED FILL — ALPRAZolam 0.25 MG TABS: 0.25 | 90 days supply | Qty: 90 | Fill #0

## 2017-11-17 MED FILL — traMADol HCL 50 MG TABS: 50 | 30 days supply | Qty: 45 | Fill #0

## 2017-12-15 MED FILL — traMADol HCL 50 MG TABS: 50 | 30 days supply | Qty: 45 | Fill #1

## 2017-12-21 MED FILL — DULoxetine HCL 60 MG CPEP: 60 | 90 days supply | Qty: 90 | Fill #0

## 2017-12-21 MED FILL — ESZOPICLONE 2 MG TAB: 2 | 90 days supply | Qty: 45 | Fill #0

## 2017-12-21 MED FILL — HYDROCHLOROTHIAZIDE 12.5 MG: 12.5 | 90 days supply | Qty: 90 | Fill #0

## 2018-01-15 MED FILL — traMADol HCL 50 MG TABS: 50 | 30 days supply | Qty: 45 | Fill #2

## 2018-02-04 MED FILL — ZOLPIDEM TART ER 12.5 MG TA: 12.5 | 90 days supply | Qty: 45 | Fill #0

## 2018-02-26 MED FILL — traMADol HCL 50 MG TABS: 50 | 30 days supply | Qty: 45 | Fill #3

## 2018-03-22 MED FILL — DULoxetine HCL 60 MG CPEP: 60 | 90 days supply | Qty: 90 | Fill #1

## 2018-03-22 MED FILL — ALPRAZolam 0.25 MG TABS: 0.25 | 90 days supply | Qty: 90 | Fill #0

## 2018-03-22 MED FILL — HYDROCHLOROTHIAZIDE 12.5 MG: 12.5 | 90 days supply | Qty: 90 | Fill #1

## 2018-03-29 MED FILL — ESZOPICLONE 2 MG TAB: 2 | 90 days supply | Qty: 45 | Fill #1

## 2018-03-30 MED FILL — traMADol HCL 50 MG TABS: 50 | 30 days supply | Qty: 45 | Fill #0

## 2018-04-07 MED FILL — PEG-3350 SOLUTION: 420 | 1 days supply | Qty: 4000 | Fill #0

## 2018-04-22 DIAGNOSIS — Z800 Family history of malignant neoplasm of digestive organs: Secondary | ICD-10-CM | POA: Diagnosis not present

## 2018-04-22 DIAGNOSIS — K5730 Diverticulosis of large intestine without perforation or abscess without bleeding: Secondary | ICD-10-CM | POA: Diagnosis not present

## 2018-04-22 DIAGNOSIS — D126 Benign neoplasm of colon, unspecified: Secondary | ICD-10-CM | POA: Diagnosis not present

## 2018-04-22 DIAGNOSIS — Z86010 Personal history of colonic polyps: Secondary | ICD-10-CM | POA: Diagnosis not present

## 2018-04-30 MED FILL — traMADol HCL 50 MG TABS: 50 | 30 days supply | Qty: 45 | Fill #1

## 2018-05-21 MED FILL — ZOLPIDEM TART ER 12.5 MG TA: 12.5 | 90 days supply | Qty: 45 | Fill #1

## 2018-06-18 MED FILL — DULoxetine HCL 60 MG CPEP: 60 | 90 days supply | Qty: 90 | Fill #2

## 2018-06-18 MED FILL — HYDROCHLOROTHIAZIDE 12.5 MG: 12.5 | 90 days supply | Qty: 90 | Fill #2

## 2018-06-18 MED FILL — traMADol HCL 50 MG TABS: 50 | 30 days supply | Qty: 45 | Fill #2

## 2018-07-16 MED FILL — IBUPROFEN 800 MG TAB: 800 | 90 days supply | Qty: 180 | Fill #0

## 2018-07-19 MED FILL — traMADol HCL 50 MG TABS: 50 | 30 days supply | Qty: 45 | Fill #3

## 2018-07-19 MED FILL — ESZOPICLONE 2 MG TAB: 2 | 90 days supply | Qty: 45 | Fill #0

## 2018-08-24 MED FILL — traMADol HCL 50 MG TABS: 50 | 23 days supply | Qty: 45 | Fill #0

## 2018-08-24 MED FILL — ZOLPIDEM TART ER 12.5 MG TA: 12.5 | 90 days supply | Qty: 45 | Fill #0

## 2018-08-24 MED FILL — ALPRAZolam 0.25 MG TABS: 0.25 | 90 days supply | Qty: 90 | Fill #0

## 2018-09-17 MED FILL — DULoxetine HCL 60 MG CPEP: 60 | 90 days supply | Qty: 90 | Fill #3

## 2018-09-17 MED FILL — HYDROCHLOROTHIAZIDE 12.5 MG: 12.5 | 90 days supply | Qty: 90 | Fill #3

## 2018-10-12 MED FILL — traMADol HCL 50 MG TABS: 50 | 23 days supply | Qty: 45 | Fill #1

## 2018-10-20 MED FILL — ZOLPIDEM TART ER 12.5 MG TA: 12.5 | 30 days supply | Qty: 30 | Fill #0

## 2018-10-25 DIAGNOSIS — I10 Essential (primary) hypertension: Secondary | ICD-10-CM | POA: Diagnosis not present

## 2018-10-25 DIAGNOSIS — Z0000 Encounter for general adult medical examination without abnormal findings: Secondary | ICD-10-CM | POA: Diagnosis not present

## 2018-10-25 DIAGNOSIS — E663 Overweight: Secondary | ICD-10-CM | POA: Diagnosis not present

## 2018-10-25 DIAGNOSIS — F3341 Major depressive disorder, recurrent, in partial remission: Secondary | ICD-10-CM | POA: Diagnosis not present

## 2018-10-25 DIAGNOSIS — M545 Low back pain: Secondary | ICD-10-CM | POA: Diagnosis not present

## 2018-10-25 DIAGNOSIS — R5383 Other fatigue: Secondary | ICD-10-CM | POA: Diagnosis not present

## 2018-10-25 DIAGNOSIS — G4700 Insomnia, unspecified: Secondary | ICD-10-CM | POA: Diagnosis not present

## 2018-11-29 MED FILL — ZOLPIDEM TART ER 12.5 MG TA: 12.5 | 30 days supply | Qty: 30 | Fill #0

## 2018-11-29 MED FILL — traMADol HCL 50 MG TABS: 50 | 30 days supply | Qty: 45 | Fill #0

## 2018-12-02 MED FILL — DULOXETINE HCL 60 MG CPEP: 60 | 90 days supply | Qty: 90 | Fill #0

## 2018-12-02 MED FILL — HYDROCHLOROTHIAZIDE 12.5 MG: 12.5 | 90 days supply | Qty: 90 | Fill #0

## 2018-12-16 MED FILL — ALPRAZolam 0.25 MG TABS: 0.25 | 90 days supply | Qty: 90 | Fill #0

## 2019-01-01 MED FILL — ZOLPIDEM TART ER 12.5 MG TA: 12.5 | 30 days supply | Qty: 30 | Fill #1

## 2019-01-01 MED FILL — traMADol HCL 50 MG TABS: 50 | 30 days supply | Qty: 45 | Fill #1

## 2019-02-02 MED FILL — ZOLPIDEM TART ER 12.5 MG TA: 12.5 | 30 days supply | Qty: 30 | Fill #2

## 2019-02-14 MED FILL — traMADol HCL 50 MG TABS: 50 | 22 days supply | Qty: 45 | Fill #0

## 2019-02-28 MED FILL — DULOXETINE HCL 60 MG CPEP: 60 | 90 days supply | Qty: 90 | Fill #1

## 2019-03-07 MED FILL — HYDROCHLOROTHIAZIDE 12.5 MG: 12.5 | 90 days supply | Qty: 90 | Fill #1

## 2019-03-07 MED FILL — ZOLPIDEM TART ER 12.5 MG TA: 12.5 | 30 days supply | Qty: 30 | Fill #3

## 2019-03-16 MED FILL — traMADol HCL 50 MG TABS: 50 | 22 days supply | Qty: 45 | Fill #1

## 2019-03-18 MED FILL — IBUPROFEN 800 MG TAB: 800 | 90 days supply | Qty: 180 | Fill #0

## 2019-04-13 MED FILL — ZOLPIDEM TART ER 12.5 MG TA: 12.5 | 30 days supply | Qty: 30 | Fill #4

## 2019-04-26 MED FILL — traMADol HCL 50 MG TABS: 50 | 22 days supply | Qty: 45 | Fill #2

## 2019-05-11 MED FILL — ZOLPIDEM TART ER 12.5 MG TA: 12.5 | 30 days supply | Qty: 30 | Fill #5

## 2019-05-26 ENCOUNTER — Other Ambulatory Visit: Payer: Self-pay | Admitting: Internal Medicine

## 2019-05-26 DIAGNOSIS — Z1231 Encounter for screening mammogram for malignant neoplasm of breast: Secondary | ICD-10-CM

## 2019-05-27 ENCOUNTER — Other Ambulatory Visit: Payer: Self-pay

## 2019-05-27 ENCOUNTER — Ambulatory Visit
Admission: RE | Admit: 2019-05-27 | Discharge: 2019-05-27 | Disposition: A | Discharge status: Home / Self Care | Payer: 59 | Source: Ambulatory Visit | Attending: Internal Medicine | Admitting: Internal Medicine

## 2019-05-27 DIAGNOSIS — Z1231 Encounter for screening mammogram for malignant neoplasm of breast: Secondary | ICD-10-CM

## 2019-05-27 MED FILL — DULOXETINE HCL 60 MG CPEP: 60 | 90 days supply | Qty: 90 | Fill #2

## 2019-05-27 MED FILL — traMADol HCL 50 MG TABS: 50 | 22 days supply | Qty: 45 | Fill #3

## 2019-06-15 MED FILL — HYDROCHLOROTHIAZIDE 12.5 MG: 12.5 | 90 days supply | Qty: 90 | Fill #2

## 2019-06-16 MED FILL — ZOLPIDEM TART ER 12.5 MG TA: 12.5 | 30 days supply | Qty: 30 | Fill #0

## 2019-06-30 MED FILL — traMADol HCL 50 MG TABS: 50 | 22 days supply | Qty: 45 | Fill #0

## 2019-07-25 MED FILL — ZOLPIDEM TART ER 12.5 MG TA: 12.5 | 30 days supply | Qty: 30 | Fill #1

## 2019-08-08 MED FILL — traMADol HCL 50 MG TABS: 50 | 22 days supply | Qty: 45 | Fill #1

## 2019-08-24 IMAGING — MG 2D DIGITAL SCREENING BILATERAL MAMMOGRAM WITH 3D TOMO WITH CAD
8 of 12 series · 8 of 28 positions shown · non-contrast
Comparison: Priors

CLINICAL DATA: Screening.

EXAM:
2D DIGITAL SCREENING BILATERAL MAMMOGRAM WITH 3D TOMO WITH CAD

[L MLO]
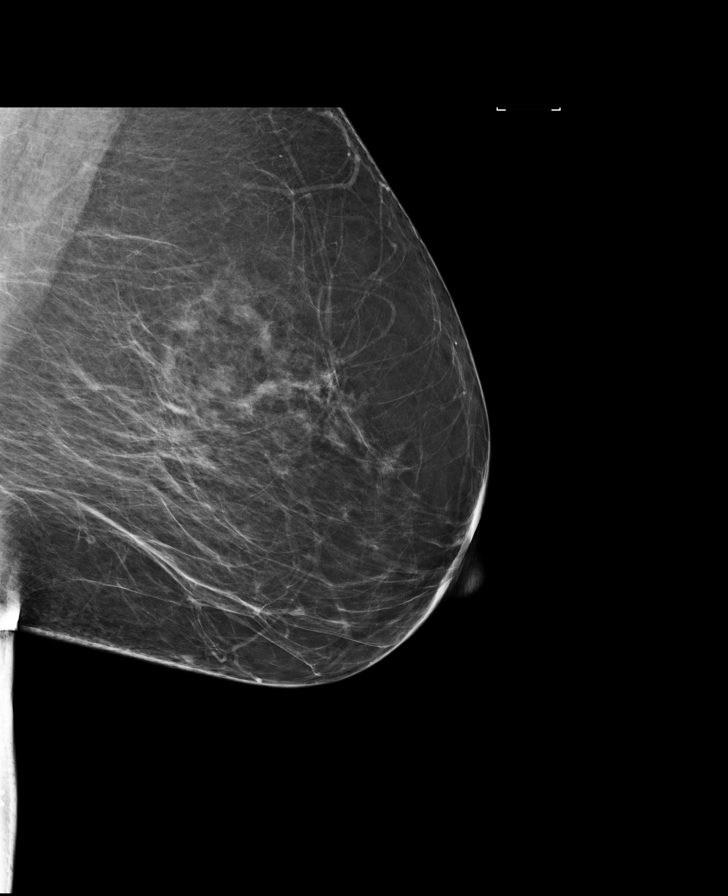

[R MLO]
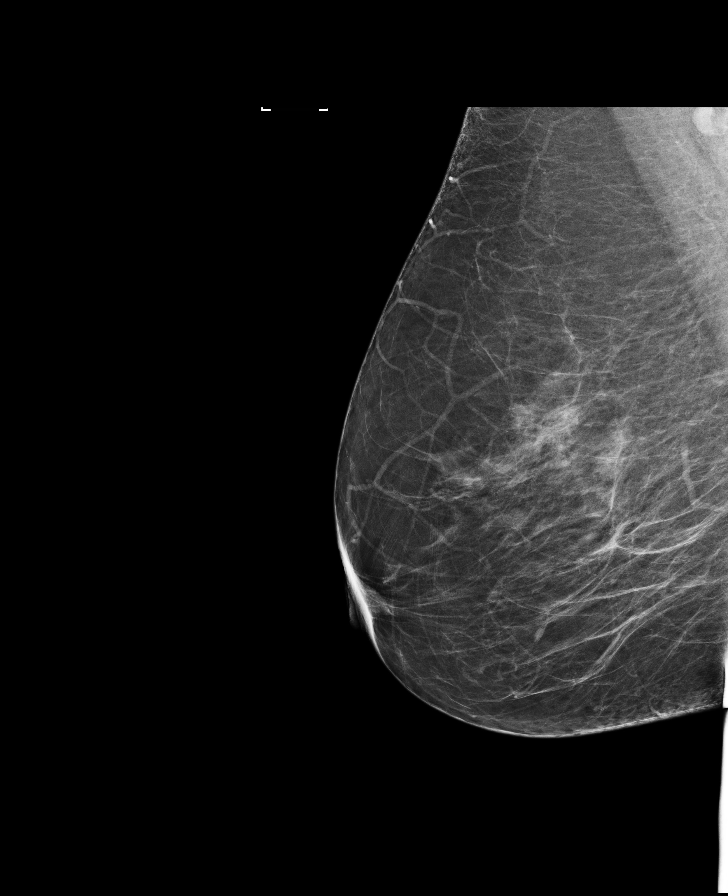

[R CC]
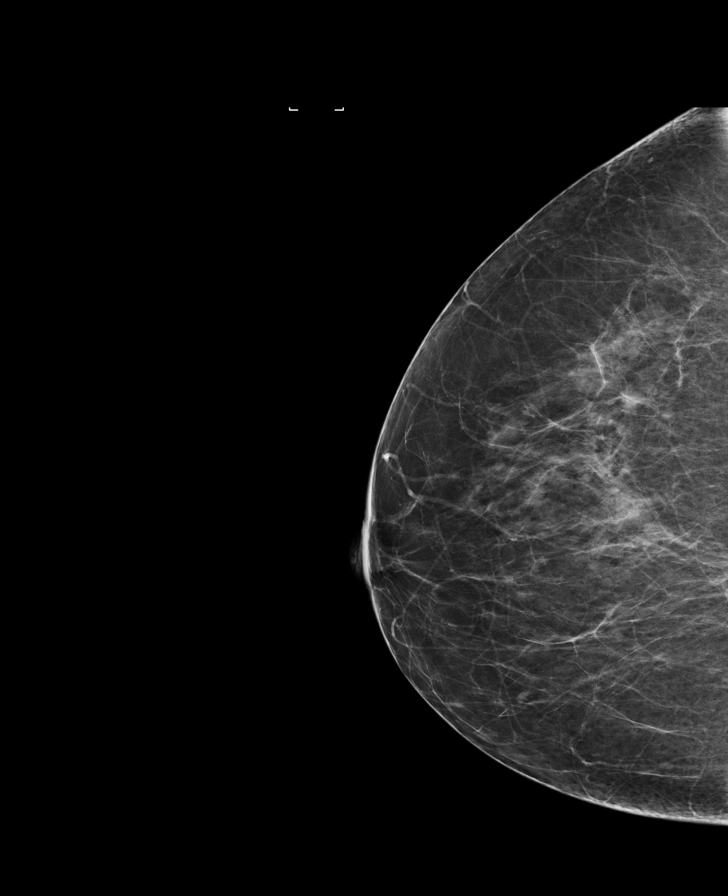

[L CC synth-2D]
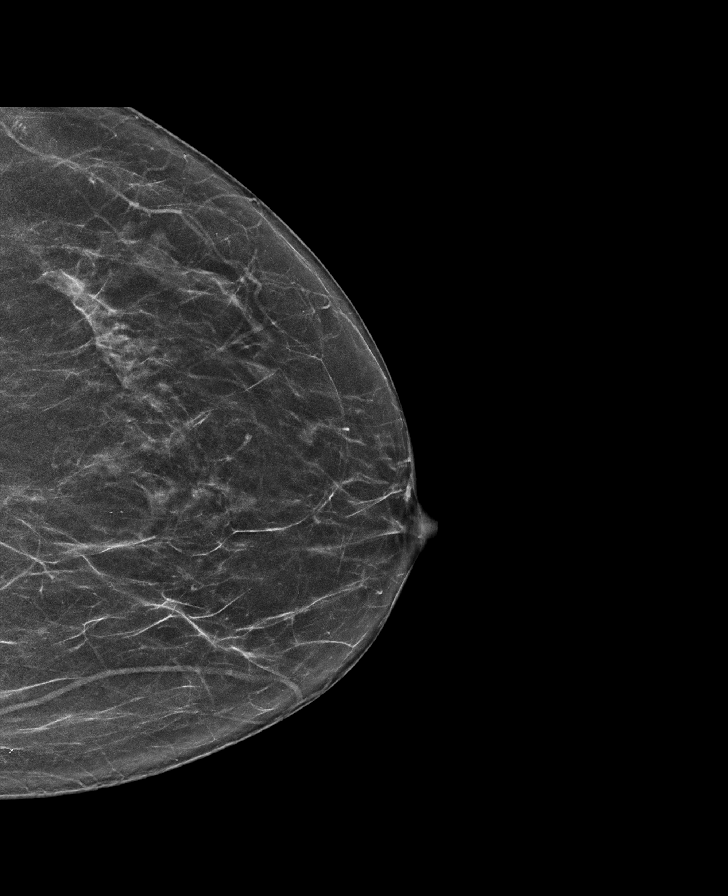

[R MLO synth-2D]
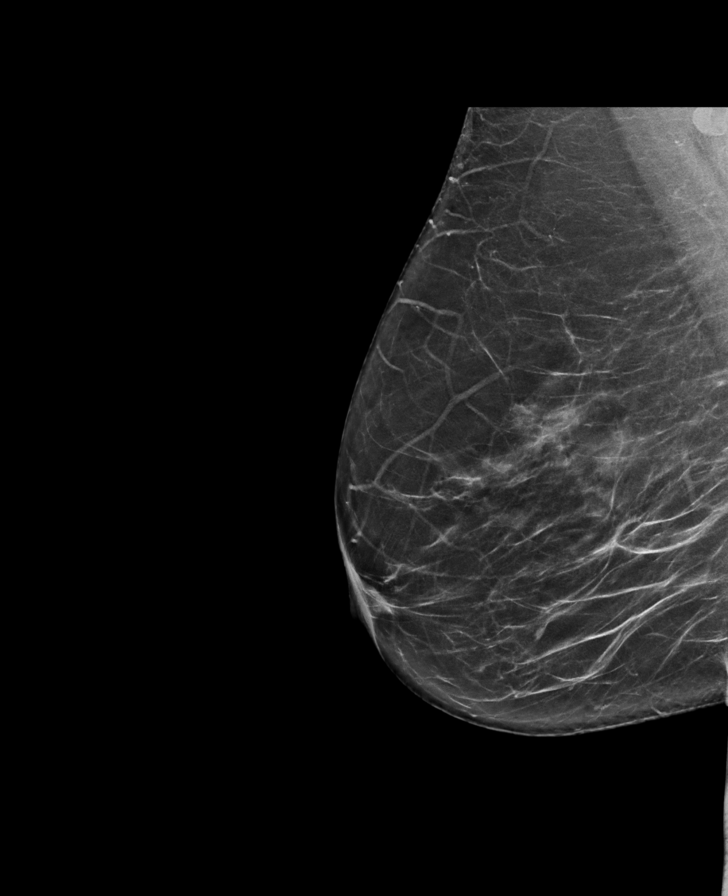

[R CC synth-2D]
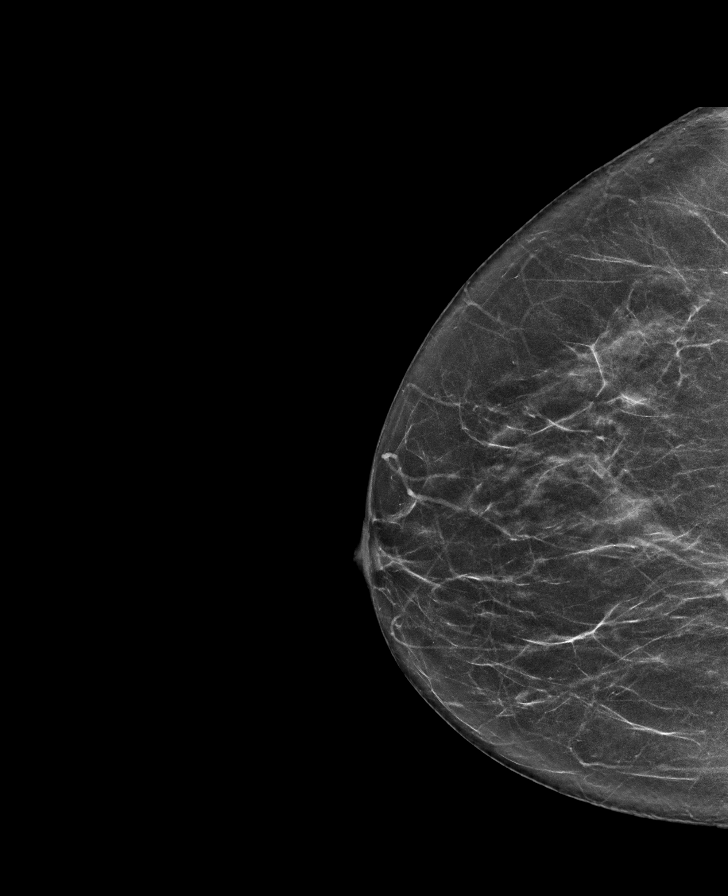

[L CC]
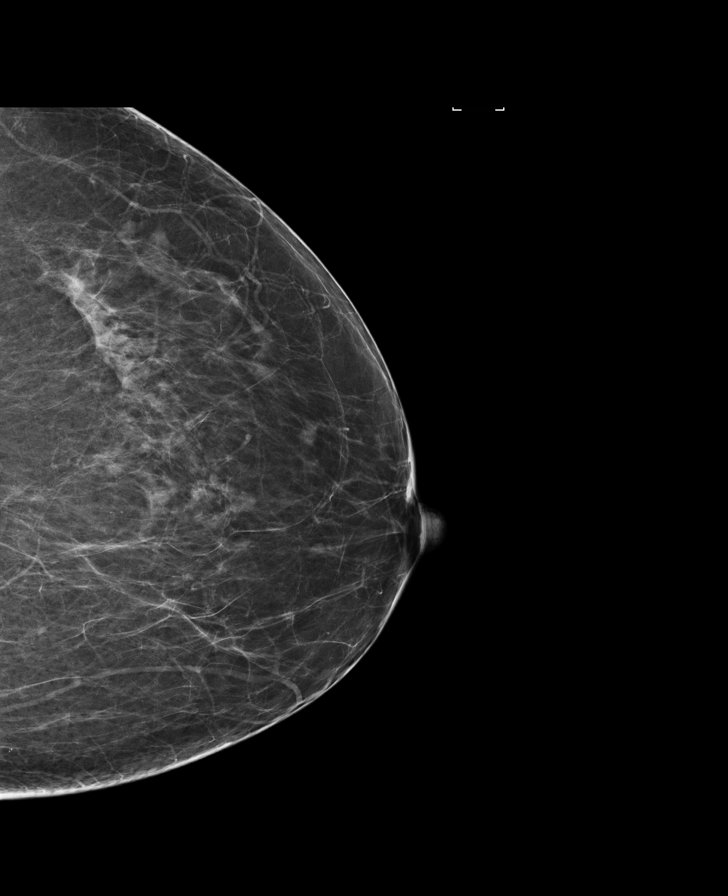

[L MLO synth-2D]
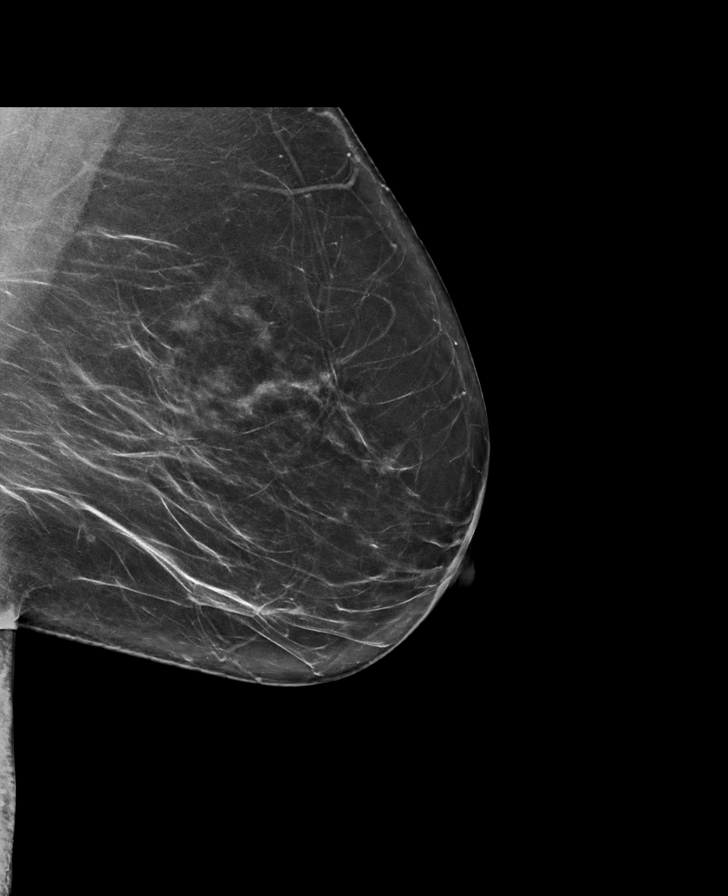

[8 of 28 positions shown; findings below may reference images not displayed]

ACR Breast Density Category c: The breast tissue is heterogeneously
dense, which may obscure small masses
FINDINGS: There are no findings suspicious for malignancy. Images were
processed with CAD.
IMPRESSION: No mammographic evidence of malignancy. A result letter of this
screening mammogram will be mailed directly to the patient.

RECOMMENDATION:
Screening mammogram in one year. (Code:[id number]-[id number])

BI-RADS CATEGORY  1: Negative.

## 2019-08-29 MED FILL — ZOLPIDEM TART ER 12.5 MG TA: 12.5 | 30 days supply | Qty: 30 | Fill #2

## 2019-08-29 MED FILL — DULOXETINE HCL 60 MG CPEP: 60 | 90 days supply | Qty: 90 | Fill #3

## 2019-09-14 MED FILL — traMADol HCL 50 MG TABS: 50 | 22 days supply | Qty: 45 | Fill #2

## 2019-09-16 MED FILL — HYDROCHLOROTHIAZIDE 12.5 MG: 12.5 | 90 days supply | Qty: 90 | Fill #3

## 2019-09-16 MED FILL — IBUPROFEN 800 MG TAB: 800 | 90 days supply | Qty: 180 | Fill #0

## 2019-09-19 MED FILL — ALPRAZolam 0.25 MG TABS: 0.25 | 90 days supply | Qty: 90 | Fill #0

## 2019-10-04 MED FILL — ZOLPIDEM TART ER 12.5 MG TA: 12.5 | 30 days supply | Qty: 30 | Fill #3

## 2019-10-20 MED FILL — traMADol HCL 50 MG TABS: 50 | 22 days supply | Qty: 45 | Fill #3

## 2019-11-08 MED FILL — ZOLPIDEM TART ER 12.5 MG TA: 12.5 | 30 days supply | Qty: 30 | Fill #4

## 2019-11-16 MED FILL — traMADol HCL 50 MG TABS: 50 | 22 days supply | Qty: 45 | Fill #0

## 2019-11-16 MED FILL — DULOXETINE HCL 60 MG CPEP: 60 | 90 days supply | Qty: 90 | Fill #0

## 2019-12-01 DIAGNOSIS — I10 Essential (primary) hypertension: Secondary | ICD-10-CM | POA: Diagnosis not present

## 2019-12-01 DIAGNOSIS — E559 Vitamin D deficiency, unspecified: Secondary | ICD-10-CM | POA: Diagnosis not present

## 2019-12-01 DIAGNOSIS — G4700 Insomnia, unspecified: Secondary | ICD-10-CM | POA: Diagnosis not present

## 2019-12-01 DIAGNOSIS — G8929 Other chronic pain: Secondary | ICD-10-CM | POA: Diagnosis not present

## 2019-12-01 DIAGNOSIS — Z0000 Encounter for general adult medical examination without abnormal findings: Secondary | ICD-10-CM | POA: Diagnosis not present

## 2019-12-01 DIAGNOSIS — F3341 Major depressive disorder, recurrent, in partial remission: Secondary | ICD-10-CM | POA: Diagnosis not present

## 2019-12-01 DIAGNOSIS — E663 Overweight: Secondary | ICD-10-CM | POA: Diagnosis not present

## 2019-12-01 DIAGNOSIS — Z13220 Encounter for screening for lipoid disorders: Secondary | ICD-10-CM | POA: Diagnosis not present

## 2019-12-01 DIAGNOSIS — H04129 Dry eye syndrome of unspecified lacrimal gland: Secondary | ICD-10-CM | POA: Diagnosis not present

## 2019-12-02 MED FILL — VIT D2 1.25 MG (50,000 UNIT: 1.25 MG | 70 days supply | Qty: 10 | Fill #0

## 2019-12-12 ENCOUNTER — Other Ambulatory Visit (HOSPITAL_COMMUNITY): Payer: Self-pay | Admitting: Internal Medicine

## 2019-12-12 MED FILL — IBUPROFEN 800 MG TAB: 800 | 90 days supply | Qty: 180 | Fill #0

## 2019-12-12 MED FILL — HYDROCHLOROTHIAZIDE 12.5 MG: 12.5 | 90 days supply | Qty: 90 | Fill #0

## 2019-12-15 MED FILL — traMADol HCL 50 MG TABS: 50 | 22 days supply | Qty: 45 | Fill #1

## 2019-12-15 MED FILL — ZOLPIDEM TART ER 12.5 MG TA: 12.5 | 30 days supply | Qty: 30 | Fill #0

## 2020-01-20 MED FILL — ZOLPIDEM TART ER 12.5 MG TA: 12.5 | 30 days supply | Qty: 30 | Fill #1

## 2020-01-24 MED FILL — traMADol HCL 50 MG TABS: 50 | 22 days supply | Qty: 45 | Fill #2

## 2020-01-30 DIAGNOSIS — H524 Presbyopia: Secondary | ICD-10-CM | POA: Diagnosis not present

## 2020-02-15 MED FILL — DULOXETINE HCL 60 MG CPEP: 60 | 90 days supply | Qty: 90 | Fill #0

## 2020-02-27 MED FILL — ZOLPIDEM TART ER 12.5 MG TA: 12.5 | 30 days supply | Qty: 30 | Fill #2

## 2020-02-29 MED FILL — ALPRAZolam 0.25 MG TABS: 0.25 | 90 days supply | Qty: 90 | Fill #0

## 2020-02-29 MED FILL — traMADol HCL 50 MG TABS: 50 | 22 days supply | Qty: 45 | Fill #3

## 2020-03-15 MED FILL — HYDROCHLOROTHIAZIDE 12.5 MG: 12.5 | 90 days supply | Qty: 90 | Fill #1

## 2020-04-03 MED FILL — ZOLPIDEM TART ER 12.5 MG TA: 12.5 | 30 days supply | Qty: 30 | Fill #3

## 2020-04-04 MED FILL — traMADol HCL 50 MG TABS: 50 | 30 days supply | Qty: 45 | Fill #0

## 2020-05-08 MED FILL — ZOLPIDEM TART ER 12.5 MG TA: 12.5 | 30 days supply | Qty: 30 | Fill #4

## 2020-05-08 MED FILL — traMADol HCL 50 MG TABS: 50 | 30 days supply | Qty: 45 | Fill #1

## 2020-05-08 MED FILL — IBUPROFEN 800 MG TAB: 800 | 90 days supply | Qty: 180 | Fill #0

## 2020-05-14 MED FILL — DULOXETINE HCL 60 MG CPEP: 60 | 90 days supply | Qty: 90 | Fill #1

## 2020-06-08 MED FILL — ZOLPIDEM TART ER 12.5 MG TA: 12.5 | 30 days supply | Qty: 30 | Fill #5

## 2020-06-08 MED FILL — HYDROCHLOROTHIAZIDE 12.5 MG: 12.5 | 90 days supply | Qty: 90 | Fill #2

## 2020-06-15 MED FILL — traMADol HCL 50 MG TABS: 50 | 30 days supply | Qty: 45 | Fill #2

## 2020-07-13 ENCOUNTER — Other Ambulatory Visit (HOSPITAL_COMMUNITY): Payer: Self-pay | Admitting: Internal Medicine

## 2020-07-13 MED FILL — ZOLPIDEM TART ER 12.5 MG TA: 12.5 | 30 days supply | Qty: 30 | Fill #0

## 2020-08-04 MED FILL — traMADol HCL 50 MG TABS: 50 | 30 days supply | Qty: 45 | Fill #3

## 2020-08-06 ENCOUNTER — Other Ambulatory Visit (HOSPITAL_COMMUNITY): Payer: Self-pay | Admitting: Internal Medicine

## 2020-08-06 MED FILL — DULOXETINE HCL 60 MG CPEP: 60 | 90 days supply | Qty: 90 | Fill #0

## 2020-08-17 MED FILL — ZOLPIDEM TART ER 12.5 MG TA: 12.5 | 30 days supply | Qty: 30 | Fill #1

## 2020-09-05 ENCOUNTER — Other Ambulatory Visit (HOSPITAL_COMMUNITY): Payer: Self-pay | Admitting: Internal Medicine

## 2020-09-05 MED FILL — HYDROCHLOROTHIAZIDE 12.5 MG: 12.5 | 90 days supply | Qty: 90 | Fill #0

## 2020-09-19 ENCOUNTER — Other Ambulatory Visit: Payer: Self-pay | Admitting: Internal Medicine

## 2020-09-19 DIAGNOSIS — Z1231 Encounter for screening mammogram for malignant neoplasm of breast: Secondary | ICD-10-CM

## 2020-09-20 ENCOUNTER — Other Ambulatory Visit (HOSPITAL_COMMUNITY): Payer: Self-pay | Admitting: Internal Medicine

## 2020-09-20 MED FILL — traMADol HCL 50 MG TABS: 50 | 30 days supply | Qty: 45 | Fill #0

## 2020-09-21 ENCOUNTER — Ambulatory Visit
Admission: RE | Admit: 2020-09-21 | Discharge: 2020-09-21 | Disposition: A | Discharge status: Home / Self Care | Payer: 59 | Source: Ambulatory Visit | Attending: Internal Medicine | Admitting: Internal Medicine

## 2020-09-21 ENCOUNTER — Other Ambulatory Visit: Payer: Self-pay

## 2020-09-21 DIAGNOSIS — Z1231 Encounter for screening mammogram for malignant neoplasm of breast: Secondary | ICD-10-CM

## 2020-09-27 MED FILL — ZOLPIDEM TART ER 12.5 MG TA: 12.5 | 30 days supply | Qty: 30 | Fill #2

## 2020-10-31 MED FILL — ZOLPIDEM TART ER 12.5 MG TA: 12.5 | 30 days supply | Qty: 30 | Fill #3

## 2020-10-31 MED FILL — DULOXETINE HCL 60 MG CPEP: 60 | 90 days supply | Qty: 90 | Fill #1

## 2020-10-31 MED FILL — traMADol HCL 50 MG TABS: 50 | 30 days supply | Qty: 45 | Fill #1

## 2020-11-16 ENCOUNTER — Other Ambulatory Visit (HOSPITAL_BASED_OUTPATIENT_CLINIC_OR_DEPARTMENT_OTHER): Payer: Self-pay

## 2020-11-30 ENCOUNTER — Other Ambulatory Visit (HOSPITAL_COMMUNITY): Payer: Self-pay

## 2020-11-30 MED FILL — Hydrochlorothiazide Cap 12.5 MG: ORAL | 90 days supply | Qty: 90 | Fill #0 | Status: AC

## 2020-12-10 ENCOUNTER — Other Ambulatory Visit (HOSPITAL_COMMUNITY): Payer: Self-pay

## 2020-12-10 MED FILL — Tramadol HCl Tab 50 MG: ORAL | 30 days supply | Qty: 45 | Fill #0 | Status: CN

## 2020-12-17 ENCOUNTER — Other Ambulatory Visit (HOSPITAL_COMMUNITY): Payer: Self-pay

## 2020-12-17 DIAGNOSIS — I10 Essential (primary) hypertension: Secondary | ICD-10-CM | POA: Diagnosis not present

## 2020-12-17 DIAGNOSIS — G8929 Other chronic pain: Secondary | ICD-10-CM | POA: Diagnosis not present

## 2020-12-17 DIAGNOSIS — M5451 Vertebrogenic low back pain: Secondary | ICD-10-CM | POA: Diagnosis not present

## 2020-12-17 DIAGNOSIS — F419 Anxiety disorder, unspecified: Secondary | ICD-10-CM | POA: Diagnosis not present

## 2020-12-17 DIAGNOSIS — G4700 Insomnia, unspecified: Secondary | ICD-10-CM | POA: Diagnosis not present

## 2020-12-17 DIAGNOSIS — Z0000 Encounter for general adult medical examination without abnormal findings: Secondary | ICD-10-CM | POA: Diagnosis not present

## 2020-12-17 DIAGNOSIS — Z13220 Encounter for screening for lipoid disorders: Secondary | ICD-10-CM | POA: Diagnosis not present

## 2020-12-17 DIAGNOSIS — E663 Overweight: Secondary | ICD-10-CM | POA: Diagnosis not present

## 2020-12-17 DIAGNOSIS — F3341 Major depressive disorder, recurrent, in partial remission: Secondary | ICD-10-CM | POA: Diagnosis not present

## 2020-12-17 DIAGNOSIS — Z23 Encounter for immunization: Secondary | ICD-10-CM | POA: Diagnosis not present

## 2020-12-17 DIAGNOSIS — E559 Vitamin D deficiency, unspecified: Secondary | ICD-10-CM | POA: Diagnosis not present

## 2020-12-17 MED ORDER — TRAMADOL HCL 50 MG PO TABS
ORAL_TABLET | ORAL | 3 refills | Status: AC
  Filled 2020-12-17 (×2): qty 60, 30d supply, fill #0
  Filled 2021-02-06: qty 60, 30d supply, fill #1
  Filled 2021-03-11: qty 60, 30d supply, fill #2
  Filled 2021-04-18: qty 60, 30d supply, fill #3

## 2020-12-19 ENCOUNTER — Other Ambulatory Visit (HOSPITAL_COMMUNITY): Payer: Self-pay

## 2020-12-21 ENCOUNTER — Other Ambulatory Visit (HOSPITAL_COMMUNITY): Payer: Self-pay

## 2020-12-21 MED FILL — Zolpidem Tartrate Tab ER 12.5 MG: ORAL | 30 days supply | Qty: 30 | Fill #0 | Status: AC

## 2021-01-31 ENCOUNTER — Other Ambulatory Visit (HOSPITAL_COMMUNITY): Payer: Self-pay

## 2021-01-31 MED ORDER — ZOLPIDEM TARTRATE ER 12.5 MG PO TBCR
EXTENDED_RELEASE_TABLET | ORAL | 5 refills | Status: AC
  Filled 2021-01-31: qty 30, 30d supply, fill #0
  Filled 2021-03-06: qty 30, 30d supply, fill #1
  Filled 2021-04-11: qty 30, 30d supply, fill #2
  Filled 2021-05-21: qty 30, 30d supply, fill #3
  Filled 2021-06-21: qty 30, 30d supply, fill #4
  Filled 2021-07-25 – 2021-07-26 (×3): qty 30, 30d supply, fill #5

## 2021-01-31 MED FILL — Duloxetine HCl Enteric Coated Pellets Cap 60 MG (Base Eq): ORAL | 90 days supply | Qty: 90 | Fill #0 | Status: AC

## 2021-02-01 ENCOUNTER — Other Ambulatory Visit (HOSPITAL_COMMUNITY): Payer: Self-pay

## 2021-02-06 ENCOUNTER — Other Ambulatory Visit (HOSPITAL_COMMUNITY): Payer: Self-pay

## 2021-02-28 ENCOUNTER — Other Ambulatory Visit (HOSPITAL_COMMUNITY): Payer: Self-pay

## 2021-02-28 MED FILL — Hydrochlorothiazide Cap 12.5 MG: ORAL | 90 days supply | Qty: 90 | Fill #1 | Status: AC

## 2021-03-07 ENCOUNTER — Other Ambulatory Visit (HOSPITAL_COMMUNITY): Payer: Self-pay

## 2021-03-08 ENCOUNTER — Other Ambulatory Visit (HOSPITAL_COMMUNITY): Payer: Self-pay

## 2021-03-11 ENCOUNTER — Other Ambulatory Visit (HOSPITAL_COMMUNITY): Payer: Self-pay

## 2021-03-12 DIAGNOSIS — Z6833 Body mass index (BMI) 33.0-33.9, adult: Secondary | ICD-10-CM | POA: Diagnosis not present

## 2021-03-12 DIAGNOSIS — R635 Abnormal weight gain: Secondary | ICD-10-CM | POA: Diagnosis not present

## 2021-03-12 DIAGNOSIS — Z713 Dietary counseling and surveillance: Secondary | ICD-10-CM | POA: Diagnosis not present

## 2021-03-12 DIAGNOSIS — Z7182 Exercise counseling: Secondary | ICD-10-CM | POA: Diagnosis not present

## 2021-03-13 DIAGNOSIS — R635 Abnormal weight gain: Secondary | ICD-10-CM | POA: Diagnosis not present

## 2021-03-27 ENCOUNTER — Other Ambulatory Visit (HOSPITAL_COMMUNITY): Payer: Self-pay

## 2021-03-27 DIAGNOSIS — R7303 Prediabetes: Secondary | ICD-10-CM | POA: Diagnosis not present

## 2021-03-27 DIAGNOSIS — E782 Mixed hyperlipidemia: Secondary | ICD-10-CM | POA: Diagnosis not present

## 2021-03-27 DIAGNOSIS — I10 Essential (primary) hypertension: Secondary | ICD-10-CM | POA: Diagnosis not present

## 2021-03-27 DIAGNOSIS — R945 Abnormal results of liver function studies: Secondary | ICD-10-CM | POA: Diagnosis not present

## 2021-03-27 MED ORDER — ATORVASTATIN CALCIUM 20 MG PO TABS
20.0000 mg | ORAL_TABLET | Freq: Every day | ORAL | 0 refills | Status: AC
  Filled 2021-03-27 (×2): qty 90, 90d supply, fill #0

## 2021-03-27 MED ORDER — OZEMPIC (0.25 OR 0.5 MG/DOSE) 2 MG/1.5ML ~~LOC~~ SOPN
0.2500 mg | PEN_INJECTOR | SUBCUTANEOUS | 1 refills | Status: DC
  Filled 2021-03-27: qty 1.5, 28d supply, fill #0
  Filled 2021-03-27: qty 1.5, 56d supply, fill #0
  Filled 2021-05-09: qty 1.5, 56d supply, fill #1

## 2021-03-28 ENCOUNTER — Other Ambulatory Visit (HOSPITAL_COMMUNITY): Payer: Self-pay

## 2021-04-12 ENCOUNTER — Other Ambulatory Visit (HOSPITAL_COMMUNITY): Payer: Self-pay

## 2021-04-15 ENCOUNTER — Other Ambulatory Visit (HOSPITAL_COMMUNITY): Payer: Self-pay

## 2021-04-16 ENCOUNTER — Other Ambulatory Visit (HOSPITAL_COMMUNITY): Payer: Self-pay

## 2021-04-16 DIAGNOSIS — L720 Epidermal cyst: Secondary | ICD-10-CM | POA: Diagnosis not present

## 2021-04-16 DIAGNOSIS — L918 Other hypertrophic disorders of the skin: Secondary | ICD-10-CM | POA: Diagnosis not present

## 2021-04-16 DIAGNOSIS — L578 Other skin changes due to chronic exposure to nonionizing radiation: Secondary | ICD-10-CM | POA: Diagnosis not present

## 2021-04-16 MED ORDER — TRETINOIN 0.025 % EX CREA
TOPICAL_CREAM | CUTANEOUS | 11 refills | Status: AC
  Filled 2021-04-16: qty 45, 30d supply, fill #0
  Filled 2021-09-11: qty 45, 30d supply, fill #1
  Filled 2021-11-25: qty 45, 30d supply, fill #2
  Filled 2022-01-08: qty 45, 30d supply, fill #3

## 2021-04-17 ENCOUNTER — Other Ambulatory Visit (HOSPITAL_COMMUNITY): Payer: Self-pay

## 2021-04-18 ENCOUNTER — Other Ambulatory Visit (HOSPITAL_COMMUNITY): Payer: Self-pay

## 2021-04-18 MED FILL — Duloxetine HCl Enteric Coated Pellets Cap 60 MG (Base Eq): ORAL | 90 days supply | Qty: 90 | Fill #1 | Status: AC

## 2021-04-19 ENCOUNTER — Other Ambulatory Visit (HOSPITAL_COMMUNITY): Payer: Self-pay

## 2021-04-25 ENCOUNTER — Other Ambulatory Visit (HOSPITAL_COMMUNITY): Payer: Self-pay

## 2021-05-02 ENCOUNTER — Other Ambulatory Visit (HOSPITAL_COMMUNITY): Payer: Self-pay

## 2021-05-03 ENCOUNTER — Other Ambulatory Visit (HOSPITAL_COMMUNITY): Payer: Self-pay

## 2021-05-09 ENCOUNTER — Other Ambulatory Visit (HOSPITAL_COMMUNITY): Payer: Self-pay

## 2021-05-10 ENCOUNTER — Other Ambulatory Visit (HOSPITAL_COMMUNITY): Payer: Self-pay

## 2021-05-21 ENCOUNTER — Other Ambulatory Visit (HOSPITAL_COMMUNITY): Payer: Self-pay

## 2021-05-21 MED FILL — Hydrochlorothiazide Cap 12.5 MG: ORAL | 90 days supply | Qty: 90 | Fill #2 | Status: AC

## 2021-05-29 ENCOUNTER — Other Ambulatory Visit (HOSPITAL_COMMUNITY): Payer: Self-pay

## 2021-05-30 ENCOUNTER — Other Ambulatory Visit (HOSPITAL_COMMUNITY): Payer: Self-pay

## 2021-05-30 MED ORDER — TRAMADOL HCL 50 MG PO TABS
ORAL_TABLET | ORAL | 3 refills | Status: DC
  Filled 2021-05-30: qty 60, 30d supply, fill #0
  Filled 2021-08-12: qty 60, 30d supply, fill #1
  Filled 2021-09-27: qty 60, 30d supply, fill #2
  Filled 2021-10-23: qty 60, 30d supply, fill #3

## 2021-06-21 ENCOUNTER — Other Ambulatory Visit: Payer: Self-pay

## 2021-06-21 ENCOUNTER — Other Ambulatory Visit (HOSPITAL_COMMUNITY): Payer: Self-pay

## 2021-06-21 ENCOUNTER — Ambulatory Visit: Payer: 59 | Attending: Internal Medicine

## 2021-06-21 ENCOUNTER — Other Ambulatory Visit (HOSPITAL_BASED_OUTPATIENT_CLINIC_OR_DEPARTMENT_OTHER): Payer: Self-pay

## 2021-06-21 DIAGNOSIS — Z23 Encounter for immunization: Secondary | ICD-10-CM

## 2021-06-21 MED ORDER — PFIZER COVID-19 VAC BIVALENT 30 MCG/0.3ML IM SUSP
INTRAMUSCULAR | 0 refills | Status: AC
  Filled 2021-06-21: qty 0.3, 1d supply, fill #0

## 2021-06-21 NOTE — Progress Notes (Signed)
   Covid-[facility] Vaccination [facility]  Name:  Michele Conrad    MRN: [id number] DOB: 01-Aug-1956  06/21/2021  Ms. Michele Conrad was observed post Covid-19 immunization for 15 minutes without incident. She was provided with Vaccine Information Sheet and instruction to access the V-Safe system.   Ms. Michele Conrad was instructed to call 911 with any severe reactions post vaccine: Difficulty breathing  Swelling of face and throat  A fast heartbeat  A bad rash all over body  Dizziness and weakness   Immunizations Administered     Name Date Dose VIS Date Route   Pfizer Covid-19 Vaccine Bivalent Booster 06/21/2021  9:55 AM 0.3 mL 04/24/2021 Intramuscular   Manufacturer: [employer]   Lot: [place]: [id number]

## 2021-06-26 ENCOUNTER — Other Ambulatory Visit (HOSPITAL_COMMUNITY): Payer: Self-pay

## 2021-06-27 ENCOUNTER — Other Ambulatory Visit (HOSPITAL_COMMUNITY): Payer: Self-pay

## 2021-06-27 MED ORDER — HYDROCHLOROTHIAZIDE 12.5 MG PO CAPS
12.5000 mg | ORAL_CAPSULE | Freq: Every day | ORAL | 0 refills | Status: AC
  Filled 2021-06-27 – 2021-09-11 (×2): qty 90, 90d supply, fill #0

## 2021-06-27 MED ORDER — ZOLPIDEM TARTRATE ER 12.5 MG PO TBCR
12.5000 mg | EXTENDED_RELEASE_TABLET | Freq: Every evening | ORAL | 0 refills | Status: AC
  Filled 2021-06-27: qty 30, 30d supply, fill #0

## 2021-06-27 MED ORDER — DULOXETINE HCL 60 MG PO CPEP
60.0000 mg | ORAL_CAPSULE | Freq: Every day | ORAL | 0 refills | Status: DC
  Filled 2021-06-27 – 2021-07-22 (×2): qty 90, 90d supply, fill #0

## 2021-06-27 MED ORDER — TRAMADOL HCL 50 MG PO TABS
50.0000 mg | ORAL_TABLET | Freq: Two times a day (BID) | ORAL | 0 refills | Status: AC | PRN
  Filled 2021-06-27: qty 60, 30d supply, fill #0

## 2021-06-27 MED ORDER — ALPRAZOLAM 0.25 MG PO TABS
0.2500 mg | ORAL_TABLET | Freq: Every day | ORAL | 0 refills | Status: AC
  Filled 2021-06-27: qty 30, 30d supply, fill #0

## 2021-06-28 ENCOUNTER — Other Ambulatory Visit (HOSPITAL_COMMUNITY): Payer: Self-pay

## 2021-06-28 MED ORDER — ZOLPIDEM TARTRATE ER 12.5 MG PO TBCR
12.5000 mg | EXTENDED_RELEASE_TABLET | Freq: Every evening | ORAL | 0 refills | Status: AC
  Filled 2021-09-11: qty 90, 90d supply, fill #0

## 2021-07-01 ENCOUNTER — Other Ambulatory Visit (HOSPITAL_COMMUNITY): Payer: Self-pay

## 2021-07-02 ENCOUNTER — Other Ambulatory Visit (HOSPITAL_COMMUNITY): Payer: Self-pay

## 2021-07-02 DIAGNOSIS — E782 Mixed hyperlipidemia: Secondary | ICD-10-CM | POA: Diagnosis not present

## 2021-07-02 DIAGNOSIS — R7303 Prediabetes: Secondary | ICD-10-CM | POA: Diagnosis not present

## 2021-07-02 DIAGNOSIS — E669 Obesity, unspecified: Secondary | ICD-10-CM | POA: Diagnosis not present

## 2021-07-02 MED ORDER — OZEMPIC (0.25 OR 0.5 MG/DOSE) 2 MG/1.5ML ~~LOC~~ SOPN
0.5000 mg | PEN_INJECTOR | SUBCUTANEOUS | 1 refills | Status: AC
  Filled 2021-07-02: qty 1.5, 28d supply, fill #0
  Filled 2021-08-19: qty 1.5, 28d supply, fill #1

## 2021-07-03 ENCOUNTER — Other Ambulatory Visit (HOSPITAL_COMMUNITY): Payer: Self-pay

## 2021-07-04 ENCOUNTER — Other Ambulatory Visit (HOSPITAL_COMMUNITY): Payer: Self-pay

## 2021-07-22 ENCOUNTER — Other Ambulatory Visit (HOSPITAL_COMMUNITY): Payer: Self-pay

## 2021-07-24 ENCOUNTER — Other Ambulatory Visit (HOSPITAL_COMMUNITY): Payer: Self-pay

## 2021-07-25 ENCOUNTER — Other Ambulatory Visit (HOSPITAL_COMMUNITY): Payer: Self-pay

## 2021-07-26 ENCOUNTER — Other Ambulatory Visit (HOSPITAL_COMMUNITY): Payer: Self-pay

## 2021-07-29 ENCOUNTER — Other Ambulatory Visit (HOSPITAL_COMMUNITY): Payer: Self-pay

## 2021-08-13 ENCOUNTER — Other Ambulatory Visit (HOSPITAL_COMMUNITY): Payer: Self-pay

## 2021-08-20 ENCOUNTER — Other Ambulatory Visit (HOSPITAL_COMMUNITY): Payer: Self-pay

## 2021-08-22 ENCOUNTER — Other Ambulatory Visit (HOSPITAL_COMMUNITY): Payer: Self-pay

## 2021-09-10 ENCOUNTER — Other Ambulatory Visit (HOSPITAL_COMMUNITY): Payer: Self-pay

## 2021-09-10 DIAGNOSIS — E669 Obesity, unspecified: Secondary | ICD-10-CM | POA: Diagnosis not present

## 2021-09-10 DIAGNOSIS — R7303 Prediabetes: Secondary | ICD-10-CM | POA: Diagnosis not present

## 2021-09-10 MED ORDER — OZEMPIC (1 MG/DOSE) 4 MG/3ML ~~LOC~~ SOPN
1.0000 mg | PEN_INJECTOR | SUBCUTANEOUS | 1 refills | Status: AC
  Filled 2021-09-10: qty 3, 28d supply, fill #0
  Filled 2021-11-11: qty 3, 28d supply, fill #1

## 2021-09-11 ENCOUNTER — Other Ambulatory Visit (HOSPITAL_COMMUNITY): Payer: Self-pay

## 2021-09-27 ENCOUNTER — Other Ambulatory Visit (HOSPITAL_COMMUNITY): Payer: Self-pay

## 2021-10-10 DIAGNOSIS — L720 Epidermal cyst: Secondary | ICD-10-CM | POA: Diagnosis not present

## 2021-10-24 ENCOUNTER — Other Ambulatory Visit (HOSPITAL_COMMUNITY): Payer: Self-pay

## 2021-10-30 ENCOUNTER — Other Ambulatory Visit (HOSPITAL_COMMUNITY): Payer: Self-pay

## 2021-10-31 ENCOUNTER — Other Ambulatory Visit (HOSPITAL_COMMUNITY): Payer: Self-pay

## 2021-10-31 MED ORDER — ALPRAZOLAM 0.25 MG PO TABS
ORAL_TABLET | ORAL | 0 refills | Status: DC
  Filled 2021-10-31: qty 30, 30d supply, fill #0

## 2021-11-04 ENCOUNTER — Other Ambulatory Visit (HOSPITAL_COMMUNITY): Payer: Self-pay

## 2021-11-04 MED ORDER — DULOXETINE HCL 60 MG PO CPEP
60.0000 mg | ORAL_CAPSULE | Freq: Every day | ORAL | 3 refills | Status: DC
  Filled 2021-11-04 – 2021-11-18 (×2): qty 90, 90d supply, fill #0
  Filled 2022-02-26: qty 90, 90d supply, fill #1
  Filled 2022-05-24: qty 90, 90d supply, fill #2
  Filled 2022-08-28: qty 90, 90d supply, fill #3

## 2021-11-11 ENCOUNTER — Other Ambulatory Visit (HOSPITAL_COMMUNITY): Payer: Self-pay

## 2021-11-11 ENCOUNTER — Other Ambulatory Visit: Payer: Self-pay | Admitting: Internal Medicine

## 2021-11-11 DIAGNOSIS — Z1231 Encounter for screening mammogram for malignant neoplasm of breast: Secondary | ICD-10-CM

## 2021-11-13 ENCOUNTER — Other Ambulatory Visit (HOSPITAL_COMMUNITY): Payer: Self-pay

## 2021-11-14 ENCOUNTER — Ambulatory Visit: Payer: 59

## 2021-11-15 ENCOUNTER — Ambulatory Visit
Admission: RE | Admit: 2021-11-15 | Discharge: 2021-11-15 | Disposition: A | Discharge status: Home / Self Care | Payer: 59 | Source: Ambulatory Visit | Attending: Internal Medicine | Admitting: Internal Medicine

## 2021-11-15 ENCOUNTER — Other Ambulatory Visit: Payer: Self-pay | Admitting: Obstetrics and Gynecology

## 2021-11-15 DIAGNOSIS — Z1231 Encounter for screening mammogram for malignant neoplasm of breast: Secondary | ICD-10-CM | POA: Diagnosis not present

## 2021-11-18 ENCOUNTER — Other Ambulatory Visit (HOSPITAL_COMMUNITY): Payer: Self-pay

## 2021-11-25 ENCOUNTER — Other Ambulatory Visit (HOSPITAL_COMMUNITY): Payer: Self-pay

## 2021-11-25 MED ORDER — ONDANSETRON 4 MG PO TBDP
ORAL_TABLET | ORAL | 6 refills | Status: AC
  Filled 2021-11-25: qty 30, 10d supply, fill #0
  Filled 2022-02-26: qty 30, 10d supply, fill #1
  Filled 2022-06-18: qty 30, 10d supply, fill #2
  Filled 2022-07-22: qty 30, 10d supply, fill #3

## 2021-12-03 ENCOUNTER — Other Ambulatory Visit (HOSPITAL_COMMUNITY): Payer: Self-pay

## 2021-12-03 MED ORDER — TRAMADOL HCL 50 MG PO TABS
ORAL_TABLET | ORAL | 3 refills | Status: AC
  Filled 2021-12-03: qty 60, 30d supply, fill #0
  Filled 2022-01-08: qty 60, 30d supply, fill #1
  Filled 2022-02-26: qty 60, 30d supply, fill #2
  Filled 2022-05-06: qty 60, 30d supply, fill #3

## 2021-12-03 MED ORDER — ALPRAZOLAM 0.25 MG PO TABS
ORAL_TABLET | ORAL | 3 refills | Status: AC
  Filled 2021-12-03: qty 30, 30d supply, fill #0
  Filled 2022-01-02: qty 30, 30d supply, fill #1
  Filled 2022-02-07: qty 30, 30d supply, fill #2
  Filled 2022-02-26 – 2022-03-07 (×2): qty 30, 30d supply, fill #3

## 2021-12-26 ENCOUNTER — Other Ambulatory Visit (HOSPITAL_COMMUNITY): Payer: Self-pay

## 2021-12-26 DIAGNOSIS — I10 Essential (primary) hypertension: Secondary | ICD-10-CM | POA: Diagnosis not present

## 2021-12-26 DIAGNOSIS — F3341 Major depressive disorder, recurrent, in partial remission: Secondary | ICD-10-CM | POA: Diagnosis not present

## 2021-12-26 DIAGNOSIS — Z800 Family history of malignant neoplasm of digestive organs: Secondary | ICD-10-CM | POA: Diagnosis not present

## 2021-12-26 DIAGNOSIS — M5451 Vertebrogenic low back pain: Secondary | ICD-10-CM | POA: Diagnosis not present

## 2021-12-26 DIAGNOSIS — K219 Gastro-esophageal reflux disease without esophagitis: Secondary | ICD-10-CM | POA: Diagnosis not present

## 2021-12-26 DIAGNOSIS — E663 Overweight: Secondary | ICD-10-CM | POA: Diagnosis not present

## 2021-12-26 DIAGNOSIS — G4700 Insomnia, unspecified: Secondary | ICD-10-CM | POA: Diagnosis not present

## 2021-12-26 DIAGNOSIS — Z0000 Encounter for general adult medical examination without abnormal findings: Secondary | ICD-10-CM | POA: Diagnosis not present

## 2021-12-26 DIAGNOSIS — F329 Major depressive disorder, single episode, unspecified: Secondary | ICD-10-CM | POA: Diagnosis not present

## 2021-12-26 MED ORDER — HYDROCHLOROTHIAZIDE 25 MG PO TABS
ORAL_TABLET | ORAL | 0 refills | Status: AC
  Filled 2021-12-26: qty 90, 90d supply, fill #0

## 2021-12-28 ENCOUNTER — Other Ambulatory Visit (HOSPITAL_COMMUNITY): Payer: Self-pay

## 2022-01-02 ENCOUNTER — Other Ambulatory Visit (HOSPITAL_COMMUNITY): Payer: Self-pay

## 2022-01-03 ENCOUNTER — Other Ambulatory Visit (HOSPITAL_COMMUNITY): Payer: Self-pay

## 2022-01-08 ENCOUNTER — Other Ambulatory Visit (HOSPITAL_COMMUNITY): Payer: Self-pay

## 2022-01-09 ENCOUNTER — Other Ambulatory Visit (HOSPITAL_COMMUNITY): Payer: Self-pay

## 2022-01-14 ENCOUNTER — Other Ambulatory Visit (HOSPITAL_COMMUNITY): Payer: Self-pay

## 2022-01-14 MED ORDER — ZOLPIDEM TARTRATE ER 12.5 MG PO TBCR
EXTENDED_RELEASE_TABLET | ORAL | 5 refills | Status: DC
  Filled 2022-01-14: qty 30, 30d supply, fill #0
  Filled 2022-02-26: qty 30, 30d supply, fill #1
  Filled 2022-04-11: qty 30, 30d supply, fill #2
  Filled 2022-05-23: qty 30, 30d supply, fill #3
  Filled 2022-07-07: qty 30, 30d supply, fill #4

## 2022-02-07 ENCOUNTER — Other Ambulatory Visit (HOSPITAL_COMMUNITY): Payer: Self-pay

## 2022-02-26 ENCOUNTER — Other Ambulatory Visit (HOSPITAL_COMMUNITY): Payer: Self-pay

## 2022-02-28 ENCOUNTER — Other Ambulatory Visit (HOSPITAL_COMMUNITY): Payer: Self-pay

## 2022-03-03 ENCOUNTER — Other Ambulatory Visit (HOSPITAL_COMMUNITY): Payer: Self-pay

## 2022-03-07 ENCOUNTER — Other Ambulatory Visit (HOSPITAL_COMMUNITY): Payer: Self-pay

## 2022-03-31 ENCOUNTER — Other Ambulatory Visit (HOSPITAL_COMMUNITY): Payer: Self-pay

## 2022-04-01 ENCOUNTER — Other Ambulatory Visit (HOSPITAL_COMMUNITY): Payer: Self-pay

## 2022-04-02 ENCOUNTER — Other Ambulatory Visit (HOSPITAL_COMMUNITY): Payer: Self-pay

## 2022-04-02 MED ORDER — HYDROCHLOROTHIAZIDE 25 MG PO TABS
ORAL_TABLET | ORAL | 3 refills | Status: DC
  Filled 2022-04-02: qty 90, 90d supply, fill #0
  Filled 2022-06-27: qty 90, 90d supply, fill #1
  Filled 2022-09-29: qty 90, 90d supply, fill #2
  Filled 2023-01-13: qty 90, 90d supply, fill #3

## 2022-04-03 ENCOUNTER — Other Ambulatory Visit (HOSPITAL_COMMUNITY): Payer: Self-pay

## 2022-04-03 DIAGNOSIS — E663 Overweight: Secondary | ICD-10-CM | POA: Diagnosis not present

## 2022-04-03 DIAGNOSIS — Z0000 Encounter for general adult medical examination without abnormal findings: Secondary | ICD-10-CM | POA: Diagnosis not present

## 2022-04-03 DIAGNOSIS — Z1329 Encounter for screening for other suspected endocrine disorder: Secondary | ICD-10-CM | POA: Diagnosis not present

## 2022-04-03 DIAGNOSIS — Z6830 Body mass index (BMI) 30.0-30.9, adult: Secondary | ICD-10-CM | POA: Diagnosis not present

## 2022-04-03 DIAGNOSIS — Z131 Encounter for screening for diabetes mellitus: Secondary | ICD-10-CM | POA: Diagnosis not present

## 2022-04-03 DIAGNOSIS — Z13220 Encounter for screening for lipoid disorders: Secondary | ICD-10-CM | POA: Diagnosis not present

## 2022-04-03 MED ORDER — WEGOVY 0.5 MG/0.5ML ~~LOC~~ SOAJ
SUBCUTANEOUS | 0 refills | Status: AC
  Filled 2022-05-26 – 2022-06-16 (×2): qty 2, 28d supply, fill #0

## 2022-04-03 MED ORDER — WEGOVY 0.25 MG/0.5ML ~~LOC~~ SOAJ
SUBCUTANEOUS | 0 refills | Status: AC
  Filled 2022-04-03 – 2022-04-08 (×2): qty 2, 28d supply, fill #0

## 2022-04-03 MED ORDER — VALACYCLOVIR HCL 1 G PO TABS
ORAL_TABLET | ORAL | 3 refills | Status: AC
  Filled 2022-04-03: qty 30, 15d supply, fill #0
  Filled 2022-05-23: qty 30, 15d supply, fill #1
  Filled 2023-02-06: qty 30, 15d supply, fill #2
  Filled 2023-02-19: qty 30, 15d supply, fill #3

## 2022-04-04 ENCOUNTER — Other Ambulatory Visit (HOSPITAL_COMMUNITY): Payer: Self-pay

## 2022-04-08 ENCOUNTER — Other Ambulatory Visit (HOSPITAL_COMMUNITY): Payer: Self-pay

## 2022-04-11 ENCOUNTER — Other Ambulatory Visit (HOSPITAL_COMMUNITY): Payer: Self-pay

## 2022-04-25 ENCOUNTER — Other Ambulatory Visit (HOSPITAL_COMMUNITY): Payer: Self-pay

## 2022-04-25 MED ORDER — ALPRAZOLAM 0.5 MG PO TABS
ORAL_TABLET | ORAL | 3 refills | Status: AC
  Filled 2022-04-25: qty 30, 30d supply, fill #0
  Filled 2022-06-16: qty 30, 30d supply, fill #1
  Filled 2022-07-22: qty 30, 30d supply, fill #2
  Filled 2022-08-19: qty 30, 30d supply, fill #3

## 2022-05-01 ENCOUNTER — Other Ambulatory Visit (HOSPITAL_COMMUNITY): Payer: Self-pay

## 2022-05-06 ENCOUNTER — Other Ambulatory Visit (HOSPITAL_COMMUNITY): Payer: Self-pay

## 2022-05-23 ENCOUNTER — Other Ambulatory Visit (HOSPITAL_COMMUNITY): Payer: Self-pay

## 2022-05-24 ENCOUNTER — Other Ambulatory Visit (HOSPITAL_COMMUNITY): Payer: Self-pay

## 2022-05-26 ENCOUNTER — Other Ambulatory Visit (HOSPITAL_COMMUNITY): Payer: Self-pay

## 2022-06-04 ENCOUNTER — Other Ambulatory Visit (HOSPITAL_COMMUNITY): Payer: Self-pay

## 2022-06-16 ENCOUNTER — Other Ambulatory Visit (HOSPITAL_COMMUNITY): Payer: Self-pay

## 2022-06-18 ENCOUNTER — Other Ambulatory Visit (HOSPITAL_COMMUNITY): Payer: Self-pay

## 2022-06-27 ENCOUNTER — Other Ambulatory Visit (HOSPITAL_COMMUNITY): Payer: Self-pay

## 2022-06-27 MED ORDER — TRAMADOL HCL 50 MG PO TABS
50.0000 mg | ORAL_TABLET | Freq: Two times a day (BID) | ORAL | 3 refills | Status: AC | PRN
  Filled 2022-06-27: qty 60, 30d supply, fill #0
  Filled 2022-08-19: qty 60, 30d supply, fill #1
  Filled 2022-10-01: qty 60, 30d supply, fill #2
  Filled 2022-11-10: qty 60, 30d supply, fill #3

## 2022-07-07 ENCOUNTER — Other Ambulatory Visit (HOSPITAL_COMMUNITY): Payer: Self-pay

## 2022-07-08 ENCOUNTER — Other Ambulatory Visit (HOSPITAL_COMMUNITY): Payer: Self-pay

## 2022-07-09 ENCOUNTER — Other Ambulatory Visit (HOSPITAL_COMMUNITY): Payer: Self-pay

## 2022-07-22 ENCOUNTER — Other Ambulatory Visit (HOSPITAL_COMMUNITY): Payer: Self-pay

## 2022-07-28 ENCOUNTER — Other Ambulatory Visit (HOSPITAL_COMMUNITY): Payer: Self-pay

## 2022-08-04 DIAGNOSIS — H25013 Cortical age-related cataract, bilateral: Secondary | ICD-10-CM | POA: Diagnosis not present

## 2022-08-19 ENCOUNTER — Other Ambulatory Visit (HOSPITAL_COMMUNITY): Payer: Self-pay

## 2022-08-20 ENCOUNTER — Other Ambulatory Visit (HOSPITAL_COMMUNITY): Payer: Self-pay

## 2022-08-20 MED ORDER — ZOLPIDEM TARTRATE ER 12.5 MG PO TBCR
12.5000 mg | EXTENDED_RELEASE_TABLET | Freq: Every day | ORAL | 3 refills | Status: AC
  Filled 2022-08-20: qty 30, 30d supply, fill #0
  Filled 2022-09-29: qty 30, 30d supply, fill #1
  Filled 2022-11-10: qty 30, 30d supply, fill #2
  Filled 2022-12-22: qty 30, 30d supply, fill #3

## 2022-08-28 ENCOUNTER — Other Ambulatory Visit (HOSPITAL_COMMUNITY): Payer: Self-pay

## 2022-09-29 ENCOUNTER — Other Ambulatory Visit: Payer: Self-pay

## 2022-09-29 ENCOUNTER — Other Ambulatory Visit (HOSPITAL_COMMUNITY): Payer: Self-pay

## 2022-09-29 MED ORDER — ALPRAZOLAM 0.5 MG PO TABS
0.5000 mg | ORAL_TABLET | Freq: Every day | ORAL | 3 refills | Status: AC | PRN
  Filled 2022-09-29: qty 30, 30d supply, fill #0
  Filled 2022-11-10: qty 30, 30d supply, fill #1
  Filled 2022-12-22: qty 30, 30d supply, fill #2
  Filled 2023-01-30: qty 30, 30d supply, fill #3

## 2022-09-30 ENCOUNTER — Other Ambulatory Visit (HOSPITAL_COMMUNITY): Payer: Self-pay

## 2022-10-01 ENCOUNTER — Other Ambulatory Visit (HOSPITAL_BASED_OUTPATIENT_CLINIC_OR_DEPARTMENT_OTHER): Payer: Self-pay

## 2022-10-01 ENCOUNTER — Other Ambulatory Visit (HOSPITAL_COMMUNITY): Payer: Self-pay

## 2022-10-14 ENCOUNTER — Other Ambulatory Visit (HOSPITAL_COMMUNITY): Payer: Self-pay

## 2022-11-10 ENCOUNTER — Other Ambulatory Visit (HOSPITAL_COMMUNITY): Payer: Self-pay

## 2022-11-19 ENCOUNTER — Other Ambulatory Visit (HOSPITAL_COMMUNITY): Payer: Self-pay

## 2022-11-19 MED ORDER — DULOXETINE HCL 60 MG PO CPEP
60.0000 mg | ORAL_CAPSULE | Freq: Every day | ORAL | 1 refills | Status: DC
  Filled 2022-11-19: qty 90, 90d supply, fill #0
  Filled 2023-02-06: qty 90, 90d supply, fill #1

## 2022-12-05 DIAGNOSIS — R5383 Other fatigue: Secondary | ICD-10-CM | POA: Diagnosis not present

## 2022-12-05 DIAGNOSIS — M791 Myalgia, unspecified site: Secondary | ICD-10-CM | POA: Diagnosis not present

## 2022-12-22 ENCOUNTER — Other Ambulatory Visit (HOSPITAL_COMMUNITY): Payer: Self-pay

## 2022-12-25 ENCOUNTER — Other Ambulatory Visit (HOSPITAL_COMMUNITY): Payer: Self-pay

## 2022-12-25 DIAGNOSIS — Z01419 Encounter for gynecological examination (general) (routine) without abnormal findings: Secondary | ICD-10-CM | POA: Diagnosis not present

## 2022-12-25 DIAGNOSIS — Z131 Encounter for screening for diabetes mellitus: Secondary | ICD-10-CM | POA: Diagnosis not present

## 2022-12-25 DIAGNOSIS — R768 Other specified abnormal immunological findings in serum: Secondary | ICD-10-CM | POA: Diagnosis not present

## 2022-12-25 DIAGNOSIS — I10 Essential (primary) hypertension: Secondary | ICD-10-CM | POA: Diagnosis not present

## 2022-12-25 DIAGNOSIS — Z90710 Acquired absence of both cervix and uterus: Secondary | ICD-10-CM | POA: Diagnosis not present

## 2022-12-25 DIAGNOSIS — Z13220 Encounter for screening for lipoid disorders: Secondary | ICD-10-CM | POA: Diagnosis not present

## 2022-12-25 DIAGNOSIS — E559 Vitamin D deficiency, unspecified: Secondary | ICD-10-CM | POA: Diagnosis not present

## 2022-12-25 DIAGNOSIS — M791 Myalgia, unspecified site: Secondary | ICD-10-CM | POA: Diagnosis not present

## 2022-12-25 DIAGNOSIS — N952 Postmenopausal atrophic vaginitis: Secondary | ICD-10-CM | POA: Diagnosis not present

## 2022-12-25 MED ORDER — VITAMIN D (ERGOCALCIFEROL) 1.25 MG (50000 UNIT) PO CAPS
50000.0000 [IU] | ORAL_CAPSULE | ORAL | 11 refills | Status: AC
  Filled 2022-12-25: qty 4, 28d supply, fill #0
  Filled 2023-01-13 – 2023-01-16 (×2): qty 4, 28d supply, fill #1
  Filled 2023-03-20 – 2023-03-27 (×2): qty 4, 28d supply, fill #2
  Filled 2023-04-25: qty 4, 28d supply, fill #3
  Filled 2023-06-01 – 2023-06-08 (×2): qty 4, 28d supply, fill #4
  Filled 2023-07-14: qty 4, 28d supply, fill #5
  Filled 2023-08-28: qty 4, 28d supply, fill #6
  Filled 2023-10-16 – 2023-10-19 (×2): qty 4, 28d supply, fill #7
  Filled 2023-12-01: qty 4, 28d supply, fill #8

## 2022-12-25 MED ORDER — TRAMADOL HCL 50 MG PO TABS
100.0000 mg | ORAL_TABLET | Freq: Two times a day (BID) | ORAL | 0 refills | Status: AC | PRN
  Filled 2022-12-25: qty 30, 15d supply, fill #0
  Filled 2022-12-25: qty 60, 15d supply, fill #0

## 2022-12-29 ENCOUNTER — Other Ambulatory Visit (HOSPITAL_COMMUNITY): Payer: Self-pay

## 2022-12-29 ENCOUNTER — Other Ambulatory Visit: Payer: Self-pay | Admitting: Obstetrics and Gynecology

## 2022-12-29 DIAGNOSIS — Z780 Asymptomatic menopausal state: Secondary | ICD-10-CM

## 2022-12-29 DIAGNOSIS — E559 Vitamin D deficiency, unspecified: Secondary | ICD-10-CM

## 2022-12-29 DIAGNOSIS — E2839 Other primary ovarian failure: Secondary | ICD-10-CM

## 2022-12-29 MED ORDER — TRAMADOL HCL 50 MG PO TABS
50.0000 mg | ORAL_TABLET | Freq: Two times a day (BID) | ORAL | 0 refills | Status: AC
  Filled 2022-12-29 – 2023-02-06 (×2): qty 60, 30d supply, fill #0

## 2022-12-30 ENCOUNTER — Other Ambulatory Visit (HOSPITAL_COMMUNITY): Payer: Self-pay

## 2022-12-31 DIAGNOSIS — H25013 Cortical age-related cataract, bilateral: Secondary | ICD-10-CM | POA: Diagnosis not present

## 2022-12-31 DIAGNOSIS — H524 Presbyopia: Secondary | ICD-10-CM | POA: Diagnosis not present

## 2022-12-31 DIAGNOSIS — H5203 Hypermetropia, bilateral: Secondary | ICD-10-CM | POA: Diagnosis not present

## 2022-12-31 DIAGNOSIS — H2513 Age-related nuclear cataract, bilateral: Secondary | ICD-10-CM | POA: Diagnosis not present

## 2023-01-13 ENCOUNTER — Other Ambulatory Visit: Payer: Self-pay

## 2023-01-13 ENCOUNTER — Other Ambulatory Visit (HOSPITAL_COMMUNITY): Payer: Self-pay

## 2023-01-30 ENCOUNTER — Other Ambulatory Visit: Payer: Self-pay

## 2023-02-02 ENCOUNTER — Other Ambulatory Visit (HOSPITAL_COMMUNITY): Payer: Self-pay

## 2023-02-03 ENCOUNTER — Other Ambulatory Visit (HOSPITAL_COMMUNITY): Payer: Self-pay

## 2023-02-05 ENCOUNTER — Other Ambulatory Visit (HOSPITAL_COMMUNITY): Payer: Self-pay

## 2023-02-05 MED ORDER — ZOLPIDEM TARTRATE ER 6.25 MG PO TBCR
6.2500 mg | EXTENDED_RELEASE_TABLET | Freq: Every evening | ORAL | 0 refills | Status: AC | PRN
  Filled 2023-02-05: qty 15, 15d supply, fill #0

## 2023-02-06 ENCOUNTER — Other Ambulatory Visit (HOSPITAL_COMMUNITY): Payer: Self-pay

## 2023-02-19 ENCOUNTER — Other Ambulatory Visit (HOSPITAL_COMMUNITY): Payer: Self-pay

## 2023-02-20 ENCOUNTER — Other Ambulatory Visit (HOSPITAL_COMMUNITY): Payer: Self-pay

## 2023-02-20 MED ORDER — ZOLPIDEM TARTRATE ER 12.5 MG PO TBCR
12.5000 mg | EXTENDED_RELEASE_TABLET | Freq: Every evening | ORAL | 0 refills | Status: AC | PRN
  Filled 2023-02-20: qty 30, 30d supply, fill #0

## 2023-02-23 ENCOUNTER — Other Ambulatory Visit (HOSPITAL_COMMUNITY): Payer: Self-pay

## 2023-02-25 ENCOUNTER — Other Ambulatory Visit: Payer: Self-pay | Admitting: Obstetrics and Gynecology

## 2023-02-25 ENCOUNTER — Other Ambulatory Visit (HOSPITAL_COMMUNITY): Payer: Self-pay

## 2023-02-25 DIAGNOSIS — Z1231 Encounter for screening mammogram for malignant neoplasm of breast: Secondary | ICD-10-CM

## 2023-02-27 ENCOUNTER — Other Ambulatory Visit (HOSPITAL_COMMUNITY): Payer: Self-pay

## 2023-02-27 ENCOUNTER — Ambulatory Visit
Admission: RE | Admit: 2023-02-27 | Discharge: 2023-02-27 | Disposition: A | Discharge status: Home / Self Care | Payer: Commercial Managed Care - PPO | Source: Ambulatory Visit | Attending: Obstetrics and Gynecology | Admitting: Obstetrics and Gynecology

## 2023-02-27 DIAGNOSIS — Z1231 Encounter for screening mammogram for malignant neoplasm of breast: Secondary | ICD-10-CM | POA: Diagnosis not present

## 2023-03-02 DIAGNOSIS — F3341 Major depressive disorder, recurrent, in partial remission: Secondary | ICD-10-CM | POA: Diagnosis not present

## 2023-03-02 DIAGNOSIS — F419 Anxiety disorder, unspecified: Secondary | ICD-10-CM | POA: Diagnosis not present

## 2023-03-02 DIAGNOSIS — G4700 Insomnia, unspecified: Secondary | ICD-10-CM | POA: Diagnosis not present

## 2023-03-02 DIAGNOSIS — G8929 Other chronic pain: Secondary | ICD-10-CM | POA: Diagnosis not present

## 2023-03-02 DIAGNOSIS — R7303 Prediabetes: Secondary | ICD-10-CM | POA: Diagnosis not present

## 2023-03-02 DIAGNOSIS — M2550 Pain in unspecified joint: Secondary | ICD-10-CM | POA: Diagnosis not present

## 2023-03-02 DIAGNOSIS — I10 Essential (primary) hypertension: Secondary | ICD-10-CM | POA: Diagnosis not present

## 2023-03-20 ENCOUNTER — Other Ambulatory Visit: Payer: Self-pay

## 2023-03-20 ENCOUNTER — Other Ambulatory Visit (HOSPITAL_COMMUNITY): Payer: Self-pay

## 2023-03-20 MED ORDER — HYDROCHLOROTHIAZIDE 25 MG PO TABS
25.0000 mg | ORAL_TABLET | Freq: Every day | ORAL | 3 refills | Status: AC
  Filled 2023-03-20 – 2023-06-08 (×3): qty 90, 90d supply, fill #0
  Filled 2023-10-16 – 2023-10-19 (×2): qty 90, 90d supply, fill #1

## 2023-03-23 ENCOUNTER — Other Ambulatory Visit (HOSPITAL_COMMUNITY): Payer: Self-pay

## 2023-03-23 MED ORDER — ALPRAZOLAM 0.5 MG PO TABS
0.5000 mg | ORAL_TABLET | Freq: Every day | ORAL | 3 refills | Status: AC
  Filled 2023-03-23: qty 30, 30d supply, fill #0
  Filled 2023-04-25: qty 30, 30d supply, fill #1
  Filled 2023-08-06: qty 30, 30d supply, fill #2
  Filled 2023-09-07: qty 30, 30d supply, fill #3

## 2023-03-27 ENCOUNTER — Other Ambulatory Visit (HOSPITAL_COMMUNITY): Payer: Self-pay

## 2023-03-27 MED ORDER — DULOXETINE HCL 60 MG PO CPEP
60.0000 mg | ORAL_CAPSULE | Freq: Every day | ORAL | 3 refills | Status: DC
  Filled 2023-03-27 – 2023-04-29 (×3): qty 90, 90d supply, fill #0
  Filled 2023-08-06: qty 90, 90d supply, fill #1
  Filled 2023-10-16 – 2023-11-10 (×2): qty 90, 90d supply, fill #2
  Filled 2024-02-09: qty 60, 60d supply, fill #3
  Filled 2024-02-09: qty 30, 30d supply, fill #3

## 2023-03-27 MED ORDER — HYDROCHLOROTHIAZIDE 25 MG PO TABS
25.0000 mg | ORAL_TABLET | Freq: Every day | ORAL | 3 refills | Status: DC
  Filled 2023-03-27: qty 90, 90d supply, fill #0
  Filled 2024-01-26: qty 90, 90d supply, fill #1

## 2023-03-30 ENCOUNTER — Other Ambulatory Visit (HOSPITAL_COMMUNITY): Payer: Self-pay

## 2023-03-30 ENCOUNTER — Other Ambulatory Visit: Payer: Self-pay

## 2023-03-30 DIAGNOSIS — R110 Nausea: Secondary | ICD-10-CM | POA: Diagnosis not present

## 2023-03-30 DIAGNOSIS — G4700 Insomnia, unspecified: Secondary | ICD-10-CM | POA: Diagnosis not present

## 2023-03-30 DIAGNOSIS — F418 Other specified anxiety disorders: Secondary | ICD-10-CM | POA: Diagnosis not present

## 2023-03-30 DIAGNOSIS — G8929 Other chronic pain: Secondary | ICD-10-CM | POA: Diagnosis not present

## 2023-03-30 MED ORDER — ONDANSETRON HCL 4 MG PO TABS
4.0000 mg | ORAL_TABLET | Freq: Every day | ORAL | 0 refills | Status: AC
  Filled 2023-03-30: qty 7, 7d supply, fill #0

## 2023-03-30 MED ORDER — ZOLPIDEM TARTRATE ER 12.5 MG PO TBCR
12.5000 mg | EXTENDED_RELEASE_TABLET | Freq: Every day | ORAL | 3 refills | Status: DC
  Filled 2023-03-30: qty 30, 30d supply, fill #0
  Filled 2023-04-25: qty 30, 30d supply, fill #1
  Filled 2023-06-18: qty 30, 30d supply, fill #2
  Filled 2023-07-27: qty 30, 30d supply, fill #3

## 2023-03-30 MED ORDER — TRAMADOL HCL 50 MG PO TABS
50.0000 mg | ORAL_TABLET | Freq: Two times a day (BID) | ORAL | 3 refills | Status: AC | PRN
  Filled 2023-03-30: qty 60, 30d supply, fill #0
  Filled 2023-04-25: qty 60, 30d supply, fill #1

## 2023-03-31 ENCOUNTER — Other Ambulatory Visit (HOSPITAL_COMMUNITY): Payer: Self-pay

## 2023-04-25 ENCOUNTER — Other Ambulatory Visit (HOSPITAL_COMMUNITY): Payer: Self-pay

## 2023-04-28 ENCOUNTER — Telehealth: Payer: Self-pay | Admitting: Cardiology

## 2023-04-28 ENCOUNTER — Other Ambulatory Visit (HOSPITAL_COMMUNITY): Payer: Self-pay

## 2023-04-29 ENCOUNTER — Other Ambulatory Visit (HOSPITAL_COMMUNITY): Payer: Self-pay

## 2023-05-13 ENCOUNTER — Other Ambulatory Visit (HOSPITAL_COMMUNITY): Payer: Self-pay

## 2023-05-13 MED ORDER — DULOXETINE HCL 30 MG PO CPEP
30.0000 mg | ORAL_CAPSULE | Freq: Every evening | ORAL | 3 refills | Status: DC
Start: 2023-05-13 — End: 2024-05-10
  Filled 2023-05-13: qty 90, 90d supply, fill #0
  Filled 2023-08-28: qty 90, 90d supply, fill #1
  Filled 2023-12-01: qty 90, 90d supply, fill #2
  Filled 2024-03-02: qty 90, 90d supply, fill #0

## 2023-05-16 ENCOUNTER — Other Ambulatory Visit (HOSPITAL_COMMUNITY): Payer: Self-pay

## 2023-05-21 ENCOUNTER — Other Ambulatory Visit (HOSPITAL_COMMUNITY): Payer: Self-pay

## 2023-05-25 DIAGNOSIS — F331 Major depressive disorder, recurrent, moderate: Secondary | ICD-10-CM | POA: Diagnosis not present

## 2023-05-30 ENCOUNTER — Other Ambulatory Visit (HOSPITAL_COMMUNITY): Payer: Self-pay

## 2023-06-01 ENCOUNTER — Other Ambulatory Visit (HOSPITAL_COMMUNITY): Payer: Self-pay

## 2023-06-04 ENCOUNTER — Other Ambulatory Visit (HOSPITAL_COMMUNITY): Payer: Self-pay

## 2023-06-04 MED ORDER — VALACYCLOVIR HCL 500 MG PO TABS
500.0000 mg | ORAL_TABLET | Freq: Every day | ORAL | 3 refills | Status: AC
Start: 2023-06-04 — End: ?
  Filled 2023-06-04: qty 90, 90d supply, fill #0
  Filled 2023-08-28: qty 90, 90d supply, fill #1
  Filled 2024-03-08: qty 36, 36d supply, fill #0
  Filled 2024-03-08: qty 54, 54d supply, fill #0
  Filled 2024-03-08: qty 90, 90d supply, fill #0

## 2023-06-05 ENCOUNTER — Other Ambulatory Visit (HOSPITAL_COMMUNITY): Payer: Self-pay

## 2023-06-08 ENCOUNTER — Other Ambulatory Visit (HOSPITAL_COMMUNITY): Payer: Self-pay

## 2023-06-09 ENCOUNTER — Other Ambulatory Visit (HOSPITAL_COMMUNITY): Payer: Self-pay

## 2023-06-09 MED ORDER — TRAMADOL HCL 50 MG PO TABS
50.0000 mg | ORAL_TABLET | Freq: Two times a day (BID) | ORAL | 3 refills | Status: DC | PRN
  Filled 2023-06-09: qty 60, 30d supply, fill #0
  Filled 2023-07-14: qty 60, 30d supply, fill #1
  Filled 2023-08-28: qty 60, 30d supply, fill #2
  Filled 2023-10-16 – 2023-10-19 (×2): qty 60, 30d supply, fill #3

## 2023-06-12 ENCOUNTER — Other Ambulatory Visit (HOSPITAL_COMMUNITY): Payer: Self-pay

## 2023-06-18 ENCOUNTER — Other Ambulatory Visit (HOSPITAL_COMMUNITY): Payer: Self-pay

## 2023-06-25 DIAGNOSIS — F331 Major depressive disorder, recurrent, moderate: Secondary | ICD-10-CM | POA: Diagnosis not present

## 2023-06-30 ENCOUNTER — Other Ambulatory Visit (HOSPITAL_COMMUNITY): Payer: Self-pay

## 2023-07-09 ENCOUNTER — Other Ambulatory Visit (HOSPITAL_COMMUNITY): Payer: Self-pay

## 2023-07-09 MED ORDER — IBUPROFEN 800 MG PO TABS
800.0000 mg | ORAL_TABLET | Freq: Four times a day (QID) | ORAL | 0 refills | Status: AC | PRN
  Filled 2023-07-09: qty 28, 7d supply, fill #0

## 2023-07-14 ENCOUNTER — Other Ambulatory Visit (HOSPITAL_COMMUNITY): Payer: Self-pay

## 2023-07-15 ENCOUNTER — Other Ambulatory Visit (HOSPITAL_COMMUNITY): Payer: Self-pay

## 2023-07-16 ENCOUNTER — Other Ambulatory Visit (HOSPITAL_COMMUNITY): Payer: Self-pay

## 2023-07-25 DIAGNOSIS — F331 Major depressive disorder, recurrent, moderate: Secondary | ICD-10-CM | POA: Diagnosis not present

## 2023-07-27 ENCOUNTER — Other Ambulatory Visit (HOSPITAL_COMMUNITY): Payer: Self-pay

## 2023-08-06 ENCOUNTER — Other Ambulatory Visit (HOSPITAL_COMMUNITY): Payer: Self-pay

## 2023-08-07 ENCOUNTER — Other Ambulatory Visit (HOSPITAL_COMMUNITY): Payer: Self-pay

## 2023-08-28 ENCOUNTER — Other Ambulatory Visit: Payer: Self-pay

## 2023-08-28 ENCOUNTER — Other Ambulatory Visit (HOSPITAL_COMMUNITY): Payer: Self-pay

## 2023-08-28 MED ORDER — ZOLPIDEM TARTRATE ER 12.5 MG PO TBCR
12.5000 mg | EXTENDED_RELEASE_TABLET | Freq: Every day | ORAL | 3 refills | Status: DC
Start: 2023-08-28 — End: 2024-03-02
  Filled 2023-08-28: qty 30, 30d supply, fill #0
  Filled 2023-10-16 – 2023-10-19 (×2): qty 30, 30d supply, fill #1
  Filled 2023-12-01: qty 30, 30d supply, fill #2
  Filled 2024-01-26: qty 30, 30d supply, fill #3

## 2023-09-01 ENCOUNTER — Other Ambulatory Visit (HOSPITAL_COMMUNITY): Payer: Self-pay

## 2023-09-01 ENCOUNTER — Inpatient Hospital Stay: Admission: RE | Admit: 2023-09-01 | Payer: Commercial Managed Care - PPO | Source: Ambulatory Visit

## 2023-09-05 ENCOUNTER — Other Ambulatory Visit: Payer: Commercial Managed Care - PPO

## 2023-09-07 ENCOUNTER — Other Ambulatory Visit: Payer: Self-pay

## 2023-09-08 ENCOUNTER — Other Ambulatory Visit (HOSPITAL_COMMUNITY): Payer: Self-pay

## 2023-10-16 ENCOUNTER — Other Ambulatory Visit (HOSPITAL_COMMUNITY): Payer: Self-pay

## 2023-10-18 ENCOUNTER — Other Ambulatory Visit: Payer: Self-pay

## 2023-10-19 ENCOUNTER — Other Ambulatory Visit (HOSPITAL_COMMUNITY): Payer: Self-pay

## 2023-10-19 MED ORDER — ALPRAZOLAM 0.5 MG PO TABS
0.5000 mg | ORAL_TABLET | Freq: Every day | ORAL | 3 refills | Status: AC | PRN
Start: 2023-10-19 — End: ?
  Filled 2023-10-19 – 2023-11-10 (×2): qty 30, 30d supply, fill #0
  Filled 2023-12-16: qty 30, 30d supply, fill #1
  Filled 2024-01-26: qty 30, 30d supply, fill #2
  Filled 2024-03-02: qty 30, 30d supply, fill #0

## 2023-10-20 ENCOUNTER — Other Ambulatory Visit (HOSPITAL_COMMUNITY): Payer: Self-pay

## 2023-10-20 ENCOUNTER — Other Ambulatory Visit: Payer: Self-pay

## 2023-10-22 DIAGNOSIS — E663 Overweight: Secondary | ICD-10-CM | POA: Diagnosis not present

## 2023-10-22 DIAGNOSIS — G8929 Other chronic pain: Secondary | ICD-10-CM | POA: Diagnosis not present

## 2023-10-22 DIAGNOSIS — I10 Essential (primary) hypertension: Secondary | ICD-10-CM | POA: Diagnosis not present

## 2023-10-22 DIAGNOSIS — Z0000 Encounter for general adult medical examination without abnormal findings: Secondary | ICD-10-CM | POA: Diagnosis not present

## 2023-10-22 DIAGNOSIS — F419 Anxiety disorder, unspecified: Secondary | ICD-10-CM | POA: Diagnosis not present

## 2023-10-22 DIAGNOSIS — G4700 Insomnia, unspecified: Secondary | ICD-10-CM | POA: Diagnosis not present

## 2023-10-22 DIAGNOSIS — Z800 Family history of malignant neoplasm of digestive organs: Secondary | ICD-10-CM | POA: Diagnosis not present

## 2023-10-22 DIAGNOSIS — Z23 Encounter for immunization: Secondary | ICD-10-CM | POA: Diagnosis not present

## 2023-11-02 ENCOUNTER — Other Ambulatory Visit (HOSPITAL_COMMUNITY): Payer: Self-pay

## 2023-11-10 ENCOUNTER — Other Ambulatory Visit (HOSPITAL_COMMUNITY): Payer: Self-pay

## 2023-11-12 DIAGNOSIS — H25013 Cortical age-related cataract, bilateral: Secondary | ICD-10-CM | POA: Diagnosis not present

## 2023-12-01 ENCOUNTER — Other Ambulatory Visit (HOSPITAL_COMMUNITY): Payer: Self-pay

## 2023-12-01 ENCOUNTER — Other Ambulatory Visit: Payer: Self-pay

## 2023-12-01 MED ORDER — TRAMADOL HCL 50 MG PO TABS
50.0000 mg | ORAL_TABLET | Freq: Two times a day (BID) | ORAL | 3 refills | Status: DC | PRN
Start: 2023-12-01 — End: 2024-04-12
  Filled 2023-12-01: qty 60, 30d supply, fill #0
  Filled 2024-01-05: qty 60, 30d supply, fill #1
  Filled 2024-02-09: qty 60, 30d supply, fill #2
  Filled 2024-03-09: qty 60, 30d supply, fill #0

## 2023-12-02 ENCOUNTER — Other Ambulatory Visit (HOSPITAL_COMMUNITY): Payer: Self-pay

## 2023-12-16 ENCOUNTER — Other Ambulatory Visit (HOSPITAL_COMMUNITY): Payer: Self-pay

## 2024-01-05 ENCOUNTER — Other Ambulatory Visit (HOSPITAL_COMMUNITY): Payer: Self-pay

## 2024-01-26 ENCOUNTER — Other Ambulatory Visit (HOSPITAL_COMMUNITY): Payer: Self-pay

## 2024-02-09 ENCOUNTER — Other Ambulatory Visit: Payer: Self-pay

## 2024-02-09 ENCOUNTER — Other Ambulatory Visit (HOSPITAL_COMMUNITY): Payer: Self-pay

## 2024-02-18 ENCOUNTER — Other Ambulatory Visit (HOSPITAL_COMMUNITY): Payer: Self-pay

## 2024-03-02 ENCOUNTER — Other Ambulatory Visit (HOSPITAL_COMMUNITY): Payer: Self-pay

## 2024-03-02 MED ORDER — ZOLPIDEM TARTRATE ER 12.5 MG PO TBCR
12.5000 mg | EXTENDED_RELEASE_TABLET | Freq: Every day | ORAL | 3 refills | Status: DC
  Filled 2024-03-02 – 2024-03-08 (×2): qty 30, 30d supply, fill #0
  Filled 2024-04-21: qty 30, 30d supply, fill #1
  Filled 2024-06-01: qty 30, 30d supply, fill #2
  Filled 2024-07-14: qty 30, 30d supply, fill #3

## 2024-03-08 ENCOUNTER — Other Ambulatory Visit (HOSPITAL_COMMUNITY): Payer: Self-pay

## 2024-03-09 ENCOUNTER — Other Ambulatory Visit: Payer: Self-pay

## 2024-04-07 ENCOUNTER — Other Ambulatory Visit (HOSPITAL_COMMUNITY): Payer: Self-pay

## 2024-04-07 MED ORDER — ALPRAZOLAM 0.5 MG PO TABS
0.5000 mg | ORAL_TABLET | Freq: Every day | ORAL | 0 refills | Status: AC | PRN
  Filled 2024-04-07: qty 30, 30d supply, fill #0

## 2024-04-07 MED ORDER — IBUPROFEN 800 MG PO TABS
800.0000 mg | ORAL_TABLET | Freq: Two times a day (BID) | ORAL | 1 refills | Status: AC | PRN
  Filled 2024-04-07: qty 90, 45d supply, fill #0

## 2024-04-08 ENCOUNTER — Other Ambulatory Visit (HOSPITAL_COMMUNITY): Payer: Self-pay

## 2024-04-11 ENCOUNTER — Encounter (HOSPITAL_COMMUNITY): Payer: Self-pay

## 2024-04-11 ENCOUNTER — Other Ambulatory Visit (HOSPITAL_COMMUNITY): Payer: Self-pay

## 2024-04-12 ENCOUNTER — Other Ambulatory Visit (HOSPITAL_COMMUNITY): Payer: Self-pay

## 2024-04-12 MED ORDER — TRAMADOL HCL 50 MG PO TABS
50.0000 mg | ORAL_TABLET | Freq: Two times a day (BID) | ORAL | 3 refills | Status: AC | PRN
  Filled 2024-04-12: qty 60, 30d supply, fill #0
  Filled 2024-05-11 (×2): qty 60, 30d supply, fill #1
  Filled 2024-06-10: qty 60, 30d supply, fill #2
  Filled 2024-07-14: qty 60, 30d supply, fill #3

## 2024-04-17 ENCOUNTER — Other Ambulatory Visit (HOSPITAL_COMMUNITY): Payer: Self-pay

## 2024-04-17 MED ORDER — ONDANSETRON HCL 4 MG PO TABS
4.0000 mg | ORAL_TABLET | Freq: Three times a day (TID) | ORAL | 0 refills | Status: AC | PRN
  Filled 2024-04-17: qty 30, 10d supply, fill #0

## 2024-04-18 ENCOUNTER — Other Ambulatory Visit (HOSPITAL_COMMUNITY): Payer: Self-pay

## 2024-04-21 ENCOUNTER — Other Ambulatory Visit (HOSPITAL_COMMUNITY): Payer: Self-pay

## 2024-04-21 MED ORDER — HYDROCHLOROTHIAZIDE 25 MG PO TABS
25.0000 mg | ORAL_TABLET | Freq: Every day | ORAL | 3 refills | Status: AC
  Filled 2024-04-21: qty 90, 90d supply, fill #0
  Filled 2024-07-14: qty 90, 90d supply, fill #1
  Filled 2024-08-30: qty 90, 90d supply, fill #0

## 2024-04-22 ENCOUNTER — Other Ambulatory Visit (HOSPITAL_COMMUNITY): Payer: Self-pay

## 2024-04-27 ENCOUNTER — Other Ambulatory Visit: Payer: Self-pay

## 2024-05-10 ENCOUNTER — Other Ambulatory Visit: Payer: Self-pay

## 2024-05-10 ENCOUNTER — Other Ambulatory Visit (HOSPITAL_COMMUNITY): Payer: Self-pay

## 2024-05-10 ENCOUNTER — Encounter (HOSPITAL_COMMUNITY): Payer: Self-pay

## 2024-05-10 MED ORDER — ALPRAZOLAM 0.5 MG PO TABS
0.5000 mg | ORAL_TABLET | Freq: Every day | ORAL | 0 refills | Status: AC
  Filled 2024-05-10: qty 30, 30d supply, fill #0

## 2024-05-10 MED ORDER — DULOXETINE HCL 30 MG PO CPEP
30.0000 mg | ORAL_CAPSULE | Freq: Every day | ORAL | 0 refills | Status: DC
  Filled 2024-05-10 – 2024-05-16 (×2): qty 90, 90d supply, fill #0

## 2024-05-11 ENCOUNTER — Other Ambulatory Visit: Payer: Self-pay

## 2024-05-11 ENCOUNTER — Other Ambulatory Visit (HOSPITAL_COMMUNITY): Payer: Self-pay

## 2024-05-16 ENCOUNTER — Other Ambulatory Visit (HOSPITAL_COMMUNITY): Payer: Self-pay

## 2024-05-17 ENCOUNTER — Other Ambulatory Visit (HOSPITAL_COMMUNITY): Payer: Self-pay

## 2024-05-24 ENCOUNTER — Other Ambulatory Visit (HOSPITAL_COMMUNITY): Payer: Self-pay

## 2024-06-01 ENCOUNTER — Other Ambulatory Visit (HOSPITAL_COMMUNITY): Payer: Self-pay

## 2024-06-09 ENCOUNTER — Other Ambulatory Visit: Payer: Self-pay | Admitting: Internal Medicine

## 2024-06-09 DIAGNOSIS — Z1231 Encounter for screening mammogram for malignant neoplasm of breast: Secondary | ICD-10-CM

## 2024-06-10 ENCOUNTER — Other Ambulatory Visit (HOSPITAL_COMMUNITY): Payer: Self-pay

## 2024-06-10 ENCOUNTER — Ambulatory Visit
Admission: RE | Admit: 2024-06-10 | Discharge: 2024-06-10 | Disposition: A | Discharge status: Home / Self Care | Source: Ambulatory Visit | Attending: Internal Medicine | Admitting: Internal Medicine

## 2024-06-10 DIAGNOSIS — Z1231 Encounter for screening mammogram for malignant neoplasm of breast: Secondary | ICD-10-CM | POA: Diagnosis not present

## 2024-06-20 ENCOUNTER — Other Ambulatory Visit (HOSPITAL_COMMUNITY): Payer: Self-pay

## 2024-06-20 ENCOUNTER — Other Ambulatory Visit: Payer: Self-pay

## 2024-06-20 MED ORDER — DULOXETINE HCL 60 MG PO CPEP
60.0000 mg | ORAL_CAPSULE | Freq: Every day | ORAL | 2 refills | Status: AC
  Filled 2024-06-20 – 2024-08-31 (×3): qty 90, 90d supply, fill #0

## 2024-06-20 MED ORDER — DULOXETINE HCL 30 MG PO CPEP
30.0000 mg | ORAL_CAPSULE | Freq: Every day | ORAL | 2 refills | Status: AC
  Filled 2024-06-20: qty 90, 90d supply, fill #0
  Filled 2024-06-20: qty 60, 60d supply, fill #0
  Filled 2024-08-30: qty 90, 90d supply, fill #0

## 2024-07-14 ENCOUNTER — Encounter (HOSPITAL_COMMUNITY): Payer: Self-pay

## 2024-07-14 ENCOUNTER — Other Ambulatory Visit (HOSPITAL_COMMUNITY): Payer: Self-pay

## 2024-07-14 MED ORDER — ALPRAZOLAM 0.5 MG PO TABS
0.5000 mg | ORAL_TABLET | Freq: Every day | ORAL | 0 refills | Status: DC | PRN
  Filled 2024-07-14: qty 30, 30d supply, fill #0

## 2024-08-26 ENCOUNTER — Other Ambulatory Visit (HOSPITAL_COMMUNITY): Payer: Self-pay

## 2024-08-29 ENCOUNTER — Other Ambulatory Visit (HOSPITAL_COMMUNITY): Payer: Self-pay

## 2024-08-29 MED ORDER — ZOLPIDEM TARTRATE ER 12.5 MG PO TBCR
12.5000 mg | EXTENDED_RELEASE_TABLET | Freq: Every day | ORAL | 3 refills | Status: AC
  Filled 2024-08-29: qty 30, 30d supply, fill #0

## 2024-08-29 MED ORDER — ALPRAZOLAM 0.5 MG PO TABS
0.5000 mg | ORAL_TABLET | Freq: Every day | ORAL | 3 refills | Status: AC | PRN
  Filled 2024-08-29: qty 30, 30d supply, fill #0

## 2024-08-30 ENCOUNTER — Other Ambulatory Visit (HOSPITAL_COMMUNITY): Payer: Self-pay

## 2024-08-31 ENCOUNTER — Other Ambulatory Visit: Payer: Self-pay
